# Patient Record
Sex: Female | Born: 1995 | Race: Asian | Hispanic: No | Marital: Single | Smoking: Never smoker
Health system: Southern US, Community
[De-identification: ages and names within clinical notes are randomized; demographics above are authoritative.]

## PROBLEM LIST (undated history)

## (undated) DIAGNOSIS — Z789 Other specified health status: Secondary | ICD-10-CM

## (undated) HISTORY — DX: Other specified health status: Z78.9

---
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
## 2019-11-05 LAB — PREGNANCY, URINE: Preg Test, Ur: POSITIVE

## 2020-01-25 ENCOUNTER — Encounter: Payer: Self-pay | Admitting: *Deleted

## 2020-01-25 ENCOUNTER — Ambulatory Visit (INDEPENDENT_AMBULATORY_CARE_PROVIDER_SITE_OTHER): Payer: Medicaid Other | Admitting: *Deleted

## 2020-01-25 ENCOUNTER — Other Ambulatory Visit: Payer: Self-pay

## 2020-01-25 DIAGNOSIS — Z3A17 17 weeks gestation of pregnancy: Secondary | ICD-10-CM

## 2020-01-25 DIAGNOSIS — O0990 Supervision of high risk pregnancy, unspecified, unspecified trimester: Secondary | ICD-10-CM

## 2020-01-25 DIAGNOSIS — O09899 Supervision of other high risk pregnancies, unspecified trimester: Secondary | ICD-10-CM | POA: Insufficient documentation

## 2020-01-25 MED ORDER — BLOOD PRESSURE KIT DEVI: 1.0000 | 0 refills | Status: AC | PRN

## 2020-01-25 MED ORDER — PRENATAL 27-0.8 MG PO TABS: 1.0000 | ORAL_TABLET | Freq: Every day | ORAL | 9 refills | Status: DC

## 2020-01-25 NOTE — Patient Instructions (Signed)

## 2020-01-25 NOTE — Progress Notes (Signed)
I connected with  Brandi Lee on 01/25/20 at  8:15 AM EDT by telephone and verified that I am speaking with the correct person using two identifiers.   I discussed the limitations, risks, security and privacy concerns of performing an evaluation and management service by telephone and the availability of in person appointments. I also discussed with the patient that there may be a patient responsible charge related to this service. The patient expressed understanding and agreed to proceed.  I explained I am completing her New OB Intake today. We discussed Her EDD and that it is based on  sure LMP . She had a pregnancy test done at [facility].  I reviewed her allergies, meds, OB History, Medical /Surgical history, and appropriate screenings. I informed her of [facility] services. She has a hx of PTD at 32 weeks by C/S.    I explained I will send her the [employer] app and app was sent to her while on phone.   I explained we will send a blood pressure cuff to [facility] that will fill that prescription and we called [facility] to verify they received her prescription and confirmed they will deliver to her home.  I asked her to bring the blood pressure cuff with her to her first ob appointment so we can show her how to use it. Explained  then we will have her take her blood pressure weekly and enter into the app.  I explained she will have some visits in office and some virtually. She already has [employer].  I reviewed her new ob  appointment date/ time with her , our location and to wear mask, no visitors.  I explained she will have a pelvic exam, ob bloodwork, hemoglobin a1C, cbg ,pap, and  genetic testing if desired,- she is undecided about a panorama.  I scheduled an [place] for first available for 02/10/20 and gave her the appointment. She voices understanding.  Greycen Felter,RN

## 2020-02-02 ENCOUNTER — Other Ambulatory Visit (HOSPITAL_COMMUNITY)
Admission: RE | Admit: 2020-02-02 | Discharge: 2020-02-02 | Disposition: A | Discharge status: Home / Self Care | Payer: Medicaid Other | Source: Ambulatory Visit | Attending: Family Medicine | Admitting: Family Medicine

## 2020-02-02 ENCOUNTER — Ambulatory Visit (INDEPENDENT_AMBULATORY_CARE_PROVIDER_SITE_OTHER): Payer: Medicaid Other | Admitting: Family Medicine

## 2020-02-02 ENCOUNTER — Encounter: Payer: Self-pay | Admitting: Family Medicine

## 2020-02-02 ENCOUNTER — Other Ambulatory Visit: Payer: Self-pay

## 2020-02-02 VITALS — BP 104/55 | HR 82 | Wt 104.9 lb

## 2020-02-02 DIAGNOSIS — O0990 Supervision of high risk pregnancy, unspecified, unspecified trimester: Secondary | ICD-10-CM

## 2020-02-02 DIAGNOSIS — O09292 Supervision of pregnancy with other poor reproductive or obstetric history, second trimester: Secondary | ICD-10-CM

## 2020-02-02 DIAGNOSIS — O09899 Supervision of other high risk pregnancies, unspecified trimester: Secondary | ICD-10-CM

## 2020-02-02 DIAGNOSIS — O34219 Maternal care for unspecified type scar from previous cesarean delivery: Secondary | ICD-10-CM

## 2020-02-02 DIAGNOSIS — Z98891 History of uterine scar from previous surgery: Secondary | ICD-10-CM | POA: Insufficient documentation

## 2020-02-02 DIAGNOSIS — Z3A19 19 weeks gestation of pregnancy: Secondary | ICD-10-CM

## 2020-02-02 LAB — POCT URINALYSIS DIP (DEVICE)
Bilirubin Urine: NEGATIVE
Glucose, UA: NEGATIVE mg/dL
Hgb urine dipstick: NEGATIVE
Ketones, ur: NEGATIVE mg/dL
Leukocytes,Ua: NEGATIVE
Nitrite: NEGATIVE
Protein, ur: NEGATIVE mg/dL
Specific Gravity, Urine: 1.01 (ref 1.005–1.030)
Urobilinogen, UA: 0.2 mg/dL (ref 0.0–1.0)
pH: 7 (ref 5.0–8.0)

## 2020-02-02 NOTE — Patient Instructions (Signed)
BENEFITS OF BREASTFEEDING Many women wonder if they should breastfeed. Research shows that breast milk contains the perfect balance of vitamins, protein and fat that your baby needs to grow. It also contains antibodies that help your baby's immune system to fight off viruses and bacteria and can reduce the risk of sudden infant death syndrome (SIDS). In addition, the colostrum (a fluid secreted from the breast in the first few days after delivery) helps your newborn's digestive system to grow and function well. Breast milk is easier to digest than formula. Also, if your baby is born preterm, breast milk can help to reduce both short- and long-term health problems. BENEFITS OF BREASTFEEDING FOR MOM . Breastfeeding causes a hormone to be released that helps the uterus to contract and return to its normal size more quickly. . It aids in postpartum weight loss, reduces risk of breast and ovarian cancer, heart disease and rheumatoid arthritis. . It decreases the amount of bleeding after the baby is born. benefits of breastfeeding for baby . Provides comfort and nutrition . Protects baby against - Obesity - Diabetes - Asthma - Childhood cancers - Heart disease - Ear infections - Diarrhea - Pneumonia - Stomach problems - Serious allergies - Skin rashes . Promotes growth and development . Reduces the risk of baby having Sudden Infant Death Syndrome (SIDS) only breastmilk for the first 6 months . Protects baby against diseases/allergies . It's the perfect amount for tiny bellies . It restores baby's energy . Provides the best nutrition for baby . Giving water or formula can make baby more likely to get sick, decrease Mom's milk supply, make baby less content with breastfeeding Skin to Skin After delivery, the staff will place your baby on your chest. This helps with the following: . Regulates baby's temperature, breathing, heart rate and blood sugar . Increases Mom's milk supply . Promotes  bonding . Keeps baby and Mom calm and decreases baby's crying Rooming In Your baby will stay in your room with you for the entire time you are in the hospital. This helps with the following: . Allows Mom to learn baby's feeding cues - Fluttering eyes - Sucking on tongue or hand - Rooting (opens mouth and turns head) - Nuzzling into the breast - Bringing hand to mouth . Allows breastfeeding on demand (when your baby is ready) . Helps baby to be calm and content . Ensures a good milk supply . Prevents complications with breastfeeding . Allows parents to learn to care for baby . Allows you to request assistance with breastfeeding Importance of a good latch . Increases milk transfer to baby - baby gets enough milk . Ensures you have enough milk for your baby . Decreases nipple soreness . Don't use pacifiers and bottles - these cause baby to suck differently than breastfeeding . Promotes continuation of breastfeeding Risks of Formula Supplementation with Breastfeeding Giving your infant formula in addition to your breast-milk EXCEPT when medically necessary can lead to: . Decreases your milk supply  . Loss of confidence in yourself for providing baby's nutrition  . Engorgement and possibly mastitis  . Asthma & allergies in the baby BREASTFEEDING FAQS How long should I breastfeed my baby? It is recommended that you provide your baby with breast milk only for the first 6 months and then continue for the first year and longer as desired. During the first few weeks after birth, your baby will need to feed 8-12 times every 24 hours, or every 2-3 hours. They will likely feed   for 15-30 minutes. How can I help my baby begin breastfeeding? Babies are born with an instinct to breastfeed. A healthy baby can begin breastfeeding right away without specific help. At the hospital, a nurse (or lactation consultant) will help you begin the process and will give you tips on good positioning. It may be  helpful to take a breastfeeding class before you deliver in order to know what to expect. How can I help my baby latch on? In order to assist your baby in latching-on, cup your breast in your hand and stroke your baby's lower lip with your nipple to stimulate your baby's rooting reflex. Your baby will look like he or she is yawning, at which point you should bring the baby towards your breast, while aiming the nipple at the roof of his or her mouth. Remember to bring the baby towards you and not your breast towards the baby. How can I tell if my baby is latched-on? Your baby will have all of your nipple and part of the dark area around the nipple in his or her mouth and your baby's nose will be touching your breast. You should see or hear the baby swallowing. If the baby is not latched-on properly, start the process over. To remove the suction, insert a clean finger between your breast and the baby's mouth. Should I switch breasts during feeding? After feeding on one side, switch the baby to your other breast. If he or she does not continue feeding - that is OK. Your baby will not necessarily need to feed from both breasts in a single feeding. On the next feeding, start with the other breast for efficiency and comfort. How can I tell if my baby is hungry? When your baby is hungry, they will nuzzle against your breast, make sucking noises and tongue motions and may put their hands near their mouth. Crying is a late sign of hunger, so you should not wait until this point. When they have received enough milk, they will unlatch from the breast. Is it okay to use a pacifier? Until your baby gets the hang of breastfeeding, experts recommend limiting pacifier usage. If you have questions about this, please contact your pediatrician. What can I do to ensure proper nutrition while breastfeeding? . Make sure that you support your own health and your baby's by eating a healthy, well-balanced diet . Your provider  may recommend that you continue to take your prenatal vitamin . Drink plenty of fluids. It is a good rule to drink one glass of water before or after feeding . Alcohol will remain in the breast milk for as long as it will remain in the blood stream. If you choose to have a drink, it is recommended that you wait at least 2 hours before feeding . Moderate amounts of caffeine are OK . Some over-the-counter or prescription medications are not recommended during breastfeeding. Check with your provider if you have questions What types of birth control methods are safe while breastfeeding? Progestin-only methods, including a daily pill, an IUD, the implant and the injection are safe while breastfeeding. Methods that contain estrogen (such as combination birth control pills, the vaginal ring and the patch) should not be used during the first month of breastfeeding as these can decrease your milk supply.  Safe Medications in Pregnancy   Acne:  Benzoyl Peroxide  Salicylic Acid   Backache/Headache:  Tylenol: 2 regular strength every 4 hours OR        2 Extra  strength every 6 hours   Colds/Coughs/Allergies:  Benadryl (alcohol free) 25 mg every 6 hours as needed  Breath right strips  Claritin  Cepacol throat lozenges  Chloraseptic throat spray  Cold-Eeze- up to three times per day  Cough drops, alcohol free  Flonase (by prescription only)  Guaifenesin  Mucinex  Robitussin DM (plain only, alcohol free)  Saline nasal spray/drops  Sudafed (pseudoephedrine) & Actifed * use only after [redacted] weeks gestation and if you do not have high blood pressure  Tylenol  Vicks Vaporub  Zinc lozenges  Zyrtec   Constipation:  Colace  Ducolax suppositories  Fleet enema  Glycerin suppositories  Metamucil  Milk of magnesia  Miralax  Senokot  Smooth move tea   Diarrhea:  Kaopectate  Imodium A-D   *NO pepto Bismol   Hemorrhoids:  Anusol  Anusol HC  Preparation H  Tucks   Indigestion:  Tums   Maalox  Mylanta  Zantac  Pepcid   Insomnia:  Benadryl (alcohol free) 25mg  every 6 hours as needed  Tylenol PM  Unisom, no Gelcaps   Leg Cramps:  Tums  MagGel   Nausea/Vomiting:  Bonine  Dramamine  Emetrol  Ginger extract  Sea bands  Meclizine  Nausea medication to take during pregnancy:  Unisom (doxylamine succinate 25 mg tablets) Take one tablet daily at bedtime. If symptoms are not adequately controlled, the dose can be increased to a maximum recommended dose of two tablets daily (1/2 tablet in the morning, 1/2 tablet mid-afternoon and one at bedtime).  Vitamin B6 100mg  tablets. Take one tablet twice a day (up to 200 mg per day).   Skin Rashes:  Aveeno products  Benadryl cream or 25mg  every 6 hours as needed  Calamine Lotion  1% cortisone cream   Yeast infection:  Gyne-lotrimin 7  Monistat 7    **If taking multiple medications, please check labels to avoid duplicating the same active ingredients  **take medication as directed on the label  ** Do not exceed 4000 mg of tylenol in 24 hours  **Do not take medications that contain aspirin or ibuprofen          Healthy Weight Gain During Pregnancy, Adult A certain amount of weight gain during pregnancy is normal and healthy. How much weight you should gain depends on your overall health and a measurement called BMI (body mass index). BMI is an estimate of your body fat based on your height and weight. You can use an to figure out your BMI, or you can ask your health care provider to calculate it for you at your next visit. Your recommended pregnancy weight gain is based on your pre-pregnancy BMI. General guidelines for a healthy total weight gain during pregnancy are listed below. If your BMI at or before the start of your pregnancy is:  Less than 18.5 (underweight), you should gain 28-40 lb (13-18 kg).  18.5-24.9 (normal weight), you should gain 25-35 lb (11-16 kg).  25-29.9 (overweight), you  should gain 15-25 lb (7-11 kg).  30 or higher (obese), you should gain 11-20 lb (5-9 kg). These ranges vary depending on your individual health. If you are carrying more than one baby (multiples), it may be safe to gain more weight than these recommendations. If you gain less weight than recommended, that may be safe as long as your baby is growing and developing normally. How can unhealthy weight gain affect me and my baby? Gaining too much weight during pregnancy can lead to pregnancy complications, such  as:  A temporary form of diabetes that develops during pregnancy (gestational diabetes).  High blood pressure during pregnancy and protein in your urine (preeclampsia).  High blood pressure during pregnancy without protein in your urine (gestational hypertension).  Your baby having a high weight at birth, which may: ? Raise your risk of having a more difficult delivery or a surgical delivery (cesarean delivery, or C-section). ? Raise your child's risk of developing obesity during childhood. Not gaining enough weight can be life-threatening for your baby, and it may raise your baby's chances of:  Being born early (preterm).  Growing more slowly than normal during pregnancy (growth restriction).  Having a low weight at birth. What actions can I take to gain a healthy amount of weight during pregnancy? General instructions  Keep track of your weight gain during pregnancy.  Take over-the-counter and prescription medicines only as told by your health care provider. Take all prenatal supplements as directed.  Keep all health care visits during pregnancy (prenatal visits). These visits are a good time to discuss your weight gain. Your health care provider will weigh you at each visit to make sure you are gaining a healthy amount of weight. Nutrition   Eat a balanced, nutrient-rich diet. Eat plenty of: ? Fruits and vegetables, such as berries and broccoli. ? Whole grains, such as  millet, barley, whole-wheat breads and cereals, and oatmeal. ? Low-fat dairy products or non-dairy products such as almond milk or rice milk. ? Protein foods, such as lean meat, chicken, eggs, and legumes (such as peas, beans, soybeans, and lentils).  Avoid foods that are fried or have a lot of fat, salt (sodium), or sugar.  Drink enough fluid to keep your urine pale yellow.  Choose healthy snack and drink options when you are at work or on the go: ? Drink water. Avoid soda, sports drinks, and juices that have added sugar. ? Avoid drinks with caffeine, such as coffee and energy drinks. ? Eat snacks that are high in protein, such as nuts, protein bars, and low-fat yogurt. ? Carry convenient snacks in your purse that do not need refrigeration, such as a pack of trail mix, an apple, or a granola bar.  If you need help improving your diet, work with a health care provider or a diet and nutrition specialist (dietitian). Activity   Exercise regularly, as told by your health care provider. ? If you were active before becoming pregnant, you may be able to continue your regular fitness activities. ? If you were not active before pregnancy, you may gradually build up to exercising for 30 or more minutes on most days of the week. This may include walking, swimming, or yoga.  Ask your health care provider what activities are safe for you. Talk with your health care provider about whether you may need to be excused from certain school or work activities. Where to find more information Learn more about managing your weight gain during pregnancy from:  American Pregnancy Association: www.americanpregnancy.org  U.[place]. Department of Agriculture pregnancy weight gain calculator: FormerBoss.no Summary  Too much weight gain during pregnancy can lead to complications for you and your baby.  Find out your pre-pregnancy BMI to determine how much weight gain is healthy for you.  Eat nutritious foods  and stay active.  Keep all of your prenatal visits as told by your health care provider. This information is not intended to replace advice given to you by your health care provider. Make sure you discuss any questions  you have with your health care provider. Document Revised: 05/13/2019 Document Reviewed: 05/10/2017 Elsevier Patient Education  2020 [employer].

## 2020-02-02 NOTE — Progress Notes (Signed)
Here for new ob visit. She did receive her bp cuff from [employer]. She brought her bp cuff and I showed her  How to use it. She returned the demonstartion properly. I reviewed bp guidelines for home.  She has not downloaded Babyscripts because she can't remember her email. She will set up another email and contact us so we can resend babyscripts if needed. She declines panorma/ horizon. Legrand Como

## 2020-02-02 NOTE — Progress Notes (Signed)
History:   Joyanna Kleman is a 24 y.o. G2P0101 at 43w6dby LMP being seen today for her first obstetrical visit.  Her obstetrical history is significant for PTL with C/S at 32w for NRFHT. Patient does intend to breast feed. Pregnancy history fully reviewed.  Patient reports no complaints.      HISTORY: OB History  Gravida Para Term Preterm AB Living  2 1 0 1 0 1  SAB TAB Ectopic Multiple Live Births  0 0 0 0 1    # Outcome Date GA Lbr Len/2nd Weight Sex Delivery Anes PTL Lv  2 Current           1 Preterm 06/13/15 349w0d2 lb (0.907 kg) F    LIV     Birth Comments: PTL, decreased fetal movement and NRFHR -C/S, nucal cord x4    Last pap smear: unsure; today  History reviewed. No pertinent past medical history. Past Surgical History:  Procedure Laterality Date  . CESAREAN SECTION     History reviewed. No pertinent family history. Social History   Tobacco Use  . Smoking status: Never Smoker  . Smokeless tobacco: Never Used  Substance Use Topics  . Alcohol use: No  . Drug use: No   No Known Allergies Current Outpatient Medications on File Prior to Visit  Medication Sig Dispense Refill  . Prenatal Vit-Fe Fumarate-FA (MULTIVITAMIN-PRENATAL) 27-0.8 MG TABS tablet Take 1 tablet by mouth daily at 12 noon. 30 tablet 9  . azithromycin (ZITHROMAX Z-PAK) 250 MG tablet Azithromycin 50055mn day 1, then 250m65m days 2-4 6 tablet 0  . Blood Pressure Monitoring (BLOOD PRESSURE KIT) DEVI 1 Device by Does not apply route as needed. 1 each 0  . cetirizine (ZYRTEC) 10 MG chewable tablet Chew 1 tablet (10 mg total) by mouth daily. Start taking after you are done taking the prednisone pack. 30 tablet 0  . naproxen (NAPROSYN) 375 MG tablet Take 1 tablet (375 mg total) by mouth 2 (two) times daily. 60 tablet 2  . sodium chloride (OCEAN NASAL SPRAY) 0.65 % nasal spray Place 1 spray into the nose as needed for congestion. 30 mL 12  . traMADol (ULTRAM) 50 MG tablet Take 1 tablet (50 mg total) by  mouth every 6 (six) hours as needed for pain. Use for severe headache episodes. 15 tablet 0   No current facility-administered medications on file prior to visit.    Review of Systems Pertinent items noted in HPI and remainder of comprehensive ROS otherwise negative. Physical Exam:   Vitals:   02/02/20 1445  BP: (!) 104/55  Pulse: 82  Weight: 104 lb 14.4 oz (47.6 kg)   Fetal Heart Rate (bpm): 155    CONSTITUTIONAL: Well-developed, well-nourished female in no acute distress.  HEENT:  Normocephalic, atraumatic. External right and left ear normal. No scleral icterus.  NECK: Normal range of motion SKIN: No rash noted. Not diaphoretic. No erythema. No pallor. MUSCULOSKELETAL: Normal range of motion. No edema noted. NEUROLOGIC: Alert and oriented to person, place, and time. Normal muscle tone coordination. No cranial nerve deficit noted. PSYCHIATRIC: Normal mood and affect. Normal behavior. Normal judgment and thought content. CARDIOVASCULAR: Normal heart rate noted RESPIRATORY: Effort normal, no problems with respiration noted ABDOMEN: Soft, non-tender. PELVIC: Normal appearing external genitalia; normal appearing vaginal mucosa and cervix.  No abnormal discharge noted.  Normal uterine size, no other palpable masses, no uterine or adnexal tenderness.  Assessment:    Pregnancy: G2P0101 Patient Active Problem List  Diagnosis Date Noted  . History of cesarean section 02/02/2020  . Supervision of high risk pregnancy, antepartum 01/25/2020  . History of preterm delivery, currently pregnant 01/25/2020     Plan:    Clotilda was seen today for initial prenatal visit.  Diagnoses and all orders for this visit:  Supervision of high risk pregnancy, antepartum -     CBC/D/Plt+RPR+Rh+ABO+Rub Ab... -     Hemoglobin A1c -     Culture, OB Urine -     Cytology - PAP( [facility]) -     Cervicovaginal ancillary only( [facility])  History of preterm delivery, currently pregnant        - Discussed 17P and patient declined  History of cesarean section       - Op note requested for CS; patient does not remember being told she could not have a vaginal delivery. Desires TOLAC if possible.   Initial labs drawn. Continue prenatal vitamins. Problem list reviewed and updated. Genetic Screening discussed, First trimester screen, Quad screen and NIPS: declined. Ultrasound discussed; fetal anatomic survey: already scheduled Discussed usage of Babyscripts and virtual visits as additional source of managing and completing prenatal visits in midst of coronavirus and pandemic.   Anticipatory guidance for prenatal visits including labs, ultrasounds, and testing; Initial labs drawn. Encouraged to complete MyChart Registration for her ability to review results, send requests, and have questions addressed.  The nature of [place] for [facility] with multiple MDs and Advanced Practice Providers was explained to patient; also emphasized that residents, students are part of our team. Routine obstetric precautions reviewed. Encouraged to seek out care at office or emergency room [facility] preferred) for urgent and/or emergent concerns. Return in about 4 weeks (around 03/01/2020) for [facility]; in-person per patient preference .    Barrington Ellison, MD [facility] Fellow, [facility] for [employer], [place]

## 2020-02-03 LAB — CBC/D/PLT+RPR+RH+ABO+RUB AB...
Antibody Screen: NEGATIVE
Basophils Absolute: 0 10*3/uL (ref 0.0–0.2)
Basos: 0 %
EOS (ABSOLUTE): 0.3 10*3/uL (ref 0.0–0.4)
Eos: 3 %
HCV Ab: <0.1 s/co ratio (ref 0.0–0.9)
HIV Screen 4th Generation wRfx: NONREACTIVE
Hematocrit: 32.1 % — ABNORMAL LOW (ref 34.0–46.6)
Hemoglobin: 10.7 g/dL — ABNORMAL LOW (ref 11.1–15.9)
Hepatitis B Surface Ag: NEGATIVE
Immature Grans (Abs): 0 10*3/uL (ref 0.0–0.1)
Immature Granulocytes: 1 %
Lymphocytes Absolute: 1.6 10*3/uL (ref 0.7–3.1)
Lymphs: 20 %
MCH: 27.3 pg (ref 26.6–33.0)
MCHC: 33.3 g/dL (ref 31.5–35.7)
MCV: 82 fL (ref 79–97)
Monocytes Absolute: 0.6 10*3/uL (ref 0.1–0.9)
Monocytes: 8 %
Neutrophils Absolute: 5.4 10*3/uL (ref 1.4–7.0)
Neutrophils: 68 %
Platelets: 251 10*3/uL (ref 150–450)
RBC: 3.92 x10E6/uL (ref 3.77–5.28)
RDW: 13.4 % (ref 11.7–15.4)
RPR Ser Ql: NONREACTIVE
Rh Factor: POSITIVE
Rubella Antibodies, IGG: 12.8 index (ref >0.99)
WBC: 7.9 10*3/uL (ref 3.4–10.8)

## 2020-02-03 LAB — HEMOGLOBIN A1C
Est. average glucose Bld gHb Est-mCnc: 88 mg/dL
Hgb A1c MFr Bld: 4.7 % — ABNORMAL LOW (ref 4.8–5.6)

## 2020-02-03 LAB — HCV INTERPRETATION

## 2020-02-04 LAB — CYTOLOGY - PAP: Diagnosis: NEGATIVE

## 2020-02-04 LAB — CERVICOVAGINAL ANCILLARY ONLY
Chlamydia: NEGATIVE
Comment: NEGATIVE
Comment: NORMAL
Neisseria Gonorrhea: NEGATIVE

## 2020-02-04 LAB — CULTURE, OB URINE

## 2020-02-04 LAB — URINE CULTURE, OB REFLEX: Organism ID, Bacteria: NO GROWTH

## 2020-02-10 ENCOUNTER — Ambulatory Visit: Payer: Medicaid Other | Attending: Family Medicine

## 2020-02-10 ENCOUNTER — Other Ambulatory Visit: Payer: Self-pay

## 2020-02-10 ENCOUNTER — Ambulatory Visit: Payer: Medicaid Other | Admitting: *Deleted

## 2020-02-10 ENCOUNTER — Other Ambulatory Visit: Payer: Self-pay | Admitting: *Deleted

## 2020-02-10 DIAGNOSIS — O365921 Maternal care for other known or suspected poor fetal growth, second trimester, fetus 1: Secondary | ICD-10-CM | POA: Diagnosis not present

## 2020-02-10 DIAGNOSIS — O09212 Supervision of pregnancy with history of pre-term labor, second trimester: Secondary | ICD-10-CM | POA: Diagnosis not present

## 2020-02-10 DIAGNOSIS — Z3A21 21 weeks gestation of pregnancy: Secondary | ICD-10-CM

## 2020-02-10 DIAGNOSIS — O09292 Supervision of pregnancy with other poor reproductive or obstetric history, second trimester: Secondary | ICD-10-CM

## 2020-02-10 DIAGNOSIS — O34219 Maternal care for unspecified type scar from previous cesarean delivery: Secondary | ICD-10-CM

## 2020-02-10 DIAGNOSIS — O0990 Supervision of high risk pregnancy, unspecified, unspecified trimester: Secondary | ICD-10-CM | POA: Diagnosis present

## 2020-02-10 DIAGNOSIS — O09899 Supervision of other high risk pregnancies, unspecified trimester: Secondary | ICD-10-CM | POA: Insufficient documentation

## 2020-02-10 DIAGNOSIS — Z363 Encounter for antenatal screening for malformations: Secondary | ICD-10-CM

## 2020-02-10 DIAGNOSIS — O359XX0 Maternal care for (suspected) fetal abnormality and damage, unspecified, not applicable or unspecified: Secondary | ICD-10-CM

## 2020-03-02 ENCOUNTER — Other Ambulatory Visit: Payer: Self-pay

## 2020-03-02 ENCOUNTER — Ambulatory Visit (INDEPENDENT_AMBULATORY_CARE_PROVIDER_SITE_OTHER): Payer: Medicaid Other | Admitting: Family Medicine

## 2020-03-02 VITALS — BP 112/64 | HR 81 | Wt 108.0 lb

## 2020-03-02 DIAGNOSIS — Z3A24 24 weeks gestation of pregnancy: Secondary | ICD-10-CM

## 2020-03-02 DIAGNOSIS — O0992 Supervision of high risk pregnancy, unspecified, second trimester: Secondary | ICD-10-CM | POA: Diagnosis not present

## 2020-03-02 DIAGNOSIS — O09899 Supervision of other high risk pregnancies, unspecified trimester: Secondary | ICD-10-CM

## 2020-03-02 DIAGNOSIS — O09212 Supervision of pregnancy with history of pre-term labor, second trimester: Secondary | ICD-10-CM

## 2020-03-02 DIAGNOSIS — Z98891 History of uterine scar from previous surgery: Secondary | ICD-10-CM

## 2020-03-02 DIAGNOSIS — O34219 Maternal care for unspecified type scar from previous cesarean delivery: Secondary | ICD-10-CM

## 2020-03-02 DIAGNOSIS — Z8759 Personal history of other complications of pregnancy, childbirth and the puerperium: Secondary | ICD-10-CM | POA: Diagnosis not present

## 2020-03-02 DIAGNOSIS — O0990 Supervision of high risk pregnancy, unspecified, unspecified trimester: Secondary | ICD-10-CM

## 2020-03-02 NOTE — Progress Notes (Signed)
   PRENATAL VISIT NOTE  Subjective:  Brandi Lee is a 24 y.o. G2P0101 at [redacted]w[redacted]d being seen today for ongoing prenatal care.  She is currently monitored for the following issues for this high-risk pregnancy and has Supervision of high risk pregnancy, antepartum; History of preterm delivery, currently pregnant; and History of cesarean section on their problem list.  Patient reports no complaints.  Contractions: Not present. Vag. Bleeding: None.  Movement: Present. Denies leaking of fluid.   The following portions of the patient's history were reviewed and updated as appropriate: allergies, current medications, past family history, past medical history, past social history, past surgical history and problem list.   Objective:   Vitals:   03/02/20 0829  BP: 112/64  Pulse: 81  Weight: 108 lb (49 kg)    Fetal Status: Fetal Heart Rate (bpm): 154   Movement: Present     General:  Alert, oriented and cooperative. Patient is in no acute distress.  Skin: Skin is warm and dry. No rash noted.   Cardiovascular: Normal heart rate noted  Respiratory: Normal respiratory effort, no problems with respiration noted  Abdomen: Soft, gravid, appropriate for gestational age.  Pain/Pressure: Absent     Pelvic: Cervical exam deferred        Extremities: Normal range of motion.  Edema: None  Mental Status: Normal mood and affect. Normal behavior. Normal judgment and thought content.   Assessment and Plan:  Pregnancy: G2P0101 at [redacted]w[redacted]d 1. Supervision of high risk pregnancy, antepartum FHT and FH normal  2. History of preterm delivery, currently pregnant Due to FGR.  3. History of prior pregnancy with IUGR newborn Serial [place]  4. H/o C/s Patient thinking about TOLAC. Risks/benefits discussed. Patient still wants to think about it.  Preterm labor symptoms and general obstetric precautions including but not limited to vaginal bleeding, contractions, leaking of fluid and fetal movement were reviewed in detail  with the patient. Please refer to After Visit Summary for other counseling recommendations.   No follow-ups on file.  Future Appointments  Date Time Provider Department Center  03/09/2020 10:45 AM [facility] NURSE [facility]  03/09/2020 10:45 AM [facility] US5 [facility]    Levie Heritage, DO

## 2020-03-03 ENCOUNTER — Encounter: Payer: Self-pay | Admitting: General Practice

## 2020-03-09 ENCOUNTER — Other Ambulatory Visit: Payer: Self-pay | Admitting: *Deleted

## 2020-03-09 ENCOUNTER — Ambulatory Visit: Payer: Medicaid Other | Attending: Obstetrics and Gynecology

## 2020-03-09 ENCOUNTER — Other Ambulatory Visit: Payer: Self-pay

## 2020-03-09 ENCOUNTER — Ambulatory Visit: Payer: Medicaid Other | Admitting: *Deleted

## 2020-03-09 DIAGNOSIS — O09899 Supervision of other high risk pregnancies, unspecified trimester: Secondary | ICD-10-CM | POA: Insufficient documentation

## 2020-03-09 DIAGNOSIS — O34219 Maternal care for unspecified type scar from previous cesarean delivery: Secondary | ICD-10-CM

## 2020-03-09 DIAGNOSIS — O0990 Supervision of high risk pregnancy, unspecified, unspecified trimester: Secondary | ICD-10-CM | POA: Insufficient documentation

## 2020-03-09 DIAGNOSIS — O09292 Supervision of pregnancy with other poor reproductive or obstetric history, second trimester: Secondary | ICD-10-CM | POA: Diagnosis present

## 2020-03-09 DIAGNOSIS — O09212 Supervision of pregnancy with history of pre-term labor, second trimester: Secondary | ICD-10-CM

## 2020-03-09 DIAGNOSIS — Z362 Encounter for other antenatal screening follow-up: Secondary | ICD-10-CM

## 2020-03-09 DIAGNOSIS — O359XX0 Maternal care for (suspected) fetal abnormality and damage, unspecified, not applicable or unspecified: Secondary | ICD-10-CM

## 2020-03-09 DIAGNOSIS — Z3A25 25 weeks gestation of pregnancy: Secondary | ICD-10-CM

## 2020-03-30 ENCOUNTER — Other Ambulatory Visit: Payer: Medicaid Other

## 2020-03-30 ENCOUNTER — Other Ambulatory Visit: Payer: Self-pay | Admitting: General Practice

## 2020-03-30 ENCOUNTER — Ambulatory Visit (INDEPENDENT_AMBULATORY_CARE_PROVIDER_SITE_OTHER): Payer: Medicaid Other | Admitting: Obstetrics and Gynecology

## 2020-03-30 VITALS — BP 108/73 | HR 92 | Wt 115.7 lb

## 2020-03-30 DIAGNOSIS — O0990 Supervision of high risk pregnancy, unspecified, unspecified trimester: Secondary | ICD-10-CM

## 2020-03-30 DIAGNOSIS — Z3A28 28 weeks gestation of pregnancy: Secondary | ICD-10-CM

## 2020-03-30 DIAGNOSIS — O09899 Supervision of other high risk pregnancies, unspecified trimester: Secondary | ICD-10-CM

## 2020-03-30 DIAGNOSIS — Z23 Encounter for immunization: Secondary | ICD-10-CM | POA: Diagnosis not present

## 2020-03-30 DIAGNOSIS — O09893 Supervision of other high risk pregnancies, third trimester: Secondary | ICD-10-CM

## 2020-03-30 DIAGNOSIS — Z98891 History of uterine scar from previous surgery: Secondary | ICD-10-CM

## 2020-03-30 DIAGNOSIS — O0993 Supervision of high risk pregnancy, unspecified, third trimester: Secondary | ICD-10-CM

## 2020-03-30 NOTE — Progress Notes (Signed)
   PRENATAL VISIT NOTE  Subjective:  Brandi Lee is a 24 y.o. G2P0101 at [redacted]w[redacted]d being seen today for ongoing prenatal care.  She is currently monitored for the following issues for this high-risk pregnancy and has Supervision of high risk pregnancy, antepartum; History of preterm delivery, currently pregnant; and History of cesarean section on their problem list.  Patient doing well with no acute concerns today. She reports no complaints.  Contractions: Not present. Vag. Bleeding: None.  Movement: Present. Denies leaking of fluid.   After much thought, pt has decided that she wants a repeat c section.  Discussed this will be performed at 39 weeks unless medically indicated.  Pt desires IUD for contraception but is unsure of what type.  Reviewed chart, no op note seen, will rerequest from [facility].  The following portions of the patient's history were reviewed and updated as appropriate: allergies, current medications, past family history, past medical history, past social history, past surgical history and problem list. Problem list updated.  Objective:   Vitals:   03/30/20 0816  BP: 108/73  Pulse: 92  Weight: 115 lb 11.2 oz (52.5 kg)    Fetal Status: Fetal Heart Rate (bpm): 155 Fundal Height: 28 cm Movement: Present     General:  Alert, oriented and cooperative. Patient is in no acute distress.  Skin: Skin is warm and dry. No rash noted.   Cardiovascular: Normal heart rate noted  Respiratory: Normal respiratory effort, no problems with respiration noted  Abdomen: Soft, gravid, appropriate for gestational age.  Pain/Pressure: Absent     Pelvic: Cervical exam deferred        Extremities: Normal range of motion.  Edema: None  Mental Status:  Normal mood and affect. Normal behavior. Normal judgment and thought content.   Assessment and Plan:  Pregnancy: G2P0101 at [redacted]w[redacted]d  1. Supervision of high risk pregnancy, antepartum Pt desires repeat c section with IUD,  28 week labs today  2.  History of preterm delivery, currently pregnant No s/sx of preterm labor  3. History of cesarean section Pt desires repeat c section, per pt this will likely be her last child  Preterm labor symptoms and general obstetric precautions including but not limited to vaginal bleeding, contractions, leaking of fluid and fetal movement were reviewed in detail with the patient.  Please refer to After Visit Summary for other counseling recommendations.   Return in about 2 weeks (around 04/13/2020) for [facility], in person.   Mariel Aloe, MD

## 2020-03-31 LAB — CBC
Hematocrit: 32.4 % — ABNORMAL LOW (ref 34.0–46.6)
Hemoglobin: 10.5 g/dL — ABNORMAL LOW (ref 11.1–15.9)
MCH: 26.7 pg (ref 26.6–33.0)
MCHC: 32.4 g/dL (ref 31.5–35.7)
MCV: 82 fL (ref 79–97)
Platelets: 238 10*3/uL (ref 150–450)
RBC: 3.93 x10E6/uL (ref 3.77–5.28)
RDW: 12.9 % (ref 11.7–15.4)
WBC: 7.6 10*3/uL (ref 3.4–10.8)

## 2020-03-31 LAB — HIV ANTIBODY (ROUTINE TESTING W REFLEX): HIV Screen 4th Generation wRfx: NONREACTIVE

## 2020-03-31 LAB — GLUCOSE TOLERANCE, 2 HOURS W/ 1HR
Glucose, 1 hour: 115 mg/dL (ref 65–179)
Glucose, 2 hour: 99 mg/dL (ref 65–152)
Glucose, Fasting: 68 mg/dL (ref 65–91)

## 2020-03-31 LAB — RPR: RPR Ser Ql: NONREACTIVE

## 2020-04-06 ENCOUNTER — Encounter: Payer: Self-pay | Admitting: *Deleted

## 2020-04-13 ENCOUNTER — Encounter: Payer: Self-pay | Admitting: Obstetrics & Gynecology

## 2020-04-13 ENCOUNTER — Other Ambulatory Visit: Payer: Self-pay

## 2020-04-13 ENCOUNTER — Ambulatory Visit (INDEPENDENT_AMBULATORY_CARE_PROVIDER_SITE_OTHER): Payer: Medicaid Other | Admitting: Obstetrics & Gynecology

## 2020-04-13 VITALS — BP 105/62 | HR 90 | Wt 118.9 lb

## 2020-04-13 DIAGNOSIS — O0990 Supervision of high risk pregnancy, unspecified, unspecified trimester: Secondary | ICD-10-CM

## 2020-04-13 DIAGNOSIS — O34219 Maternal care for unspecified type scar from previous cesarean delivery: Secondary | ICD-10-CM

## 2020-04-13 NOTE — Progress Notes (Signed)
   PRENATAL VISIT NOTE  Subjective:  Brandi Lee is a 24 y.o. G2P0101 at [redacted]w[redacted]d being seen today for ongoing prenatal care.  She is currently monitored for the following issues for this low-risk pregnancy and has Supervision of high risk pregnancy, antepartum; History of preterm delivery, currently pregnant; and History of cesarean section on their problem list.  Patient reports no complaints.  Contractions: Not present. Vag. Bleeding: None.  Movement: Present. Denies leaking of fluid.   The following portions of the patient's history were reviewed and updated as appropriate: allergies, current medications, past family history, past medical history, past social history, past surgical history and problem list.   Objective:   Vitals:   04/13/20 1551  BP: 105/62  Pulse: 90  Weight: 118 lb 14.4 oz (53.9 kg)    Fetal Status: Fetal Heart Rate (bpm): 144   Movement: Present     General:  Alert, oriented and cooperative. Patient is in no acute distress.  Skin: Skin is warm and dry. No rash noted.   Cardiovascular: Normal heart rate noted  Respiratory: Normal respiratory effort, no problems with respiration noted  Abdomen: Soft, gravid, appropriate for gestational age.  Pain/Pressure: Present     Pelvic: Cervical exam deferred        Extremities: Normal range of motion.  Edema: Trace  Mental Status: Normal mood and affect. Normal behavior. Normal judgment and thought content.   Assessment and Plan:  Pregnancy: G2P0101 at [redacted]w[redacted]d 1. Supervision of high risk pregnancy, antepartum Mother doing well. Plan for C/S at 39 weeks. Follow up in 2 weeks.   Preterm labor symptoms and general obstetric precautions including but not limited to vaginal bleeding, contractions, leaking of fluid and fetal movement were reviewed in detail with the patient. Please refer to After Visit Summary for other counseling recommendations.   No follow-ups on file.  Future Appointments  Date Time Provider Department  Center  04/20/2020  9:30 AM [facility] NURSE [facility]  04/20/2020  9:45 AM [facility] US5 [facility]  04/27/2020  3:55 PM Alric Seton, MD [facility]  05/11/2020  3:55 PM Adam Phenix, MD [facility]    Malachy Chamber, MD

## 2020-04-13 NOTE — Progress Notes (Signed)
Addendum: Patient not using babyscripts- she states she never received the email. I explained we will reach out to Babys[employer] to resend the email.  Korbyn Chopin,RN

## 2020-04-13 NOTE — Patient Instructions (Signed)
AREA PEDIATRIC/FAMILY PRACTICE PHYSICIANS  [place] ([address]) . [facility] o Chambliss, MD; Eniola, MD; Hale, MD; Hensel, MD; McDiarmid, MD; McIntyer, MD; Neal, MD; Walden, MD o [address] [place], [place] [address] o [phone] o Mon-Fri 8:30-12:30, 1:30-5:00 o Providers come to see babies at [facility] o Accepting Medicaid . [facility] o Limited providers who accept newborns: Koirala, MD; Morrow, MD; Wolters, MD o [address], [place], [place] [address] o [phone] o Mon-Fri 8:00-5:30 o Babies seen by providers at [facility] o Does NOT accept Medicaid o Please call early in hospitalization for appointment (limited availability)  . [facility] o Mulberry, MD o [address] [place], [place] [address] o [phone] o Mon, Tue, Thur, Fri 8:30-5:00, Wed 10:00-7:00 (closed 1-2pm) o Babies seen by [facility] providers o Accepting Medicaid . [facility] o Rubin, MD o [address], [place], [place] [address] o [phone] o Mon-Fri 8:30-5:00, Sat 8:30-12:00 o Provider comes to see babies at [facility] o Accepting Medicaid o Must have been referred from current patients or contacted office prior to delivery . [facility] ([facility]) o Brown, MD; Chandler, MD; Ettefagh, MD; Grant, MD; Lester, MD; McCormick, MD; McQueen, MD; Prose, MD; Simha, MD; Stanley, MD; Stryffeler, NP; Tebben, NP o [address], [place], [place] [address] o [phone] o Mon, Tue, Thur, Fri 8:30-5:30, Wed 9:30-5:30, Sat 8:30-12:30 o Babies seen by [facility] providers o Accepting Medicaid o Only accepting infants of first-time parents or siblings of current patients o Hospital discharge coordinator will make follow-up appointment . Jack Amos o [address],  [place], [place]  [address] o [phone]   Fax - [phone] . [facility] o [address], [place], [place]  [address] o Phone - [phone]   Fax - [phone] . Shilpa Gosrani o [address], [place], [place]  [address] o [phone]  [place] ([address]) . [facility] o Bates, MD; [place]field, MD; Cooper, Cox, MD; MD; Davis, MD; Dovico, MD; Ettefaugh, MD; Little, MD; Lowe, MD; Keiffer, MD; Melvin, MD; Sumner, MD; Williams, MD o [address], [place], [place] [address] o [phone] o Mon-Fri 8:30-5:00 (extended evenings Mon-Thur as needed), Sat-Sun 10:00-1:00 o Providers come to see babies at [facility] o Accepting Medicaid for families of first-time babies and families with all children in the household age 3 and under. Must register with office prior to making appointment (M-F only). . [facility] o Henson, NP; Knapp, MD; Lalonde, MD; Tysinger, PA o [address] [place], [place] [address] o [phone] o Mon-Fri 8:00-5:00 o Babies seen by providers at [facility] o Does NOT accept Medicaid/Commercial Insurance Only . [facility] ([facility])  o Artis, MD; Barnes, MD; Bratton, MD; Coccaro, MD; Lockett Gardner, MD; Kramer, MD; Marshall, MD; Netherton, MD; Poleto, MD; Skinner, MD o [address] [place], [place] [address] o [phone] o Mon-Fri 8:30-5:30, Sat (Oct.-Mar.) 9:00-1:00 o Babies seen by providers at [facility] o Accepting Medicaid  [place] ([address]) . [facility] o Reid, MD; Warner, MD o [address], , [place] [address] o [phone] o Mon-Fri 8:30-5:00, Sat 8:30-12:00 o Providers come to see babies at [facility] o Does NOT accept Medicaid . [facility] o Becker, PA; Hagler, MD; Scifres, PA; Sun, MD; Swayne, MD o [address],  [place], [place] [address] o [phone] o Mon-Fri 8:00-5:00 o Babies seen by providers at [facility] o Does NOT accept Medicaid o Only accepting babies of parents who are patients o Please call early in hospitalization for appointment (limited availability) . [facility] o Clark, MD; Frye, MD; Kelleher, MD; Mack, NP; Miller, MD; O'Keller, MD; Patterson, NP; Pudlo, MD; Puzio, MD; Thomas, MD; Tucker, MD; Twiselton, MD o [address], [place], [place] [address] o [phone] o Mon-Fri 8:00-5:00, Sat 9:00-12:00 o Providers come to see babies at [facility] o Does NOT accept Medicaid  [place] ([address]) . [facility] o Limited providers accepting new patients: Brake, NP; Wharton, PA o [address], [place], [place] [address] o [phone] o Mon-Fri 8:00-5:00 o Babies seen by providers at [facility] o Does NOT accept Medicaid o Only accepting babies of parents who are patients o Please call early in hospitalization for appointment (limited availability) . [facility] o Gay, MD; Quinlan, MD o [address] [place], [place] [address] o [phone] (press 1 to schedule appointment) o Mon-Fri 8:00-5:00 o Providers come to see babies at [facility] o Does NOT accept Medicaid . [facility] o Mazer, MD o [address] [place], [place] [address] o [phone] o Mon-Fri 8:30-5:00 (lunch 12:30-1:00), extended hours by appointment only Wed 5:00-6:30 o Babies seen by [facility] providers o Accepting Medicaid . [facility] o Banks, MD; Jordan, MD; Koberlein, MD o [address], [place], [place] [address] o [phone] o Mon-Fri 8:00-5:00 o Babies seen by [facility] providers o Does NOT accept Medicaid . [facility] o Parker, MD; Hunter, MD; Wallace, DO o [address] [place], [place]  [address] o [phone] o Mon-Fri 8:00-5:00 o Babies seen by [facility] providers o Does NOT accept Medicaid . [facility] o Brandon, PA; Brecken, PA; Christy, NP; Dees, MD; DeClaire, MD; DeWeese, MD; Hansen, NP; Mills, NP; Parrish, NP; Smoot, NP; Summer, MD; Vapne, MD o [address] [place], [place] [address] o [phone] o Mon-Fri 8:30-5:00, Sat 10:00-1:00 o Providers come to see babies at [facility] o Does NOT accept Medicaid o Free prenatal information session Tuesdays at 4:45pm . [facility] o Bouska, MD; Gordon, PA; Jeffery, PA; Weber, PA o [address] [place] [address] o [phone] o Mon-Fri 7:30-5:30 o Babies seen by [facility] providers . [facility] o [address], [place], [place]  [address] o [phone]   Fax - [phone]  [place] ([address]) . [facility] o Reese, MD o [address] [place], [place] [address] o [phone] o Mon-Thur 8:00-6:00 o Providers come to see babies at [facility] o Accepting Medicaid . [facility] o Anderson, NP; Badger, MD; Beal, PA; Spencer, PA o [address] [place], [place] [address] o [phone] o Mon-Thur 7:30-7:30, Fri 7:30-4:30 o Babies seen by [facility] providers o Accepting Medicaid . [facility] o Agbuya, MD; Klett, NP; Romgoolam, MD o [address], [place],  [address] o [phone] o Mon-Fri 8:30-5:00, Sat 8:30-12:00 o Providers come to see babies at [facility] o Accepting Medicaid o Must have "Meet & Greet" appointment at office prior to delivery . [facility] o McCord,   MD; Wallace, MD; Wood, MD o [address], [place], [place] [address] o [phone] o Mon-Wed 8:00-6:00, Thur-Fri 8:00-5:00, Sat 9:00-12:00 o Providers come to  see babies at [facility] o Does NOT accept Medicaid o Only accepting siblings of current patients . [facility]  o [address], [place], [place]  [address] o [phone]   Fax - [phone] . [facility] o [address], [place], [place]  [address] o [phone]   Fax - [phone]  [place] ([address]) . [facility] o Cirigliano, DO; Matthews, DO o [address] [place], [place] [address] o [phone] o Mon-Fri 7:00-5:00 o Babies seen by [facility] providers o Does NOT accept Medicaid . [facility] o Briscoe, MD; Howley, PA; Moreira, PA o [address], [place], [place] [address] o [phone] o Mon-Fri 8:00-5:00 o Babies seen by [facility] providers o Accepting Medicaid . [facility] o Boyd, MD; Church, PA; Jones, NP; Osborn, PA o [address], , [place] [address] o [phone] o Mon-Fri 8:00-5:00 o Babies seen by providers at [facility] o Accepting Medicaid  [place] ([address]) . [facility] [place] o Wendling, DO o [address] [place], [place] [address] o [phone] o Mon-Fri 8:00-5:00 o Babies seen by [facility] providers o Does NOT accept Medicaid o Limited availability, please call early in hospitalization to schedule follow-up . [facility] o Calderon, PA; Cummings, MD; Dillard, MD; Martin, PA; Olson, MD; VanDeven, PA o [address], [place], [place] [address] o [phone] o Mon-Fri 8:30-5:00, Sat 9:00-12:00 o Babies seen by providers at [facility] o Accepting Medicaid o Please register online then schedule online or call office o [email or web address] . [facility] ([facility] o Hunter, NP; Kumar, MD; Martin Rogers, PA o [address], [place], [place] [address] o [phone] o Mon-Fri 8:00-5:00 o Babies seen by providers at [facility] o Accepting Medicaid . [facility] ([facility] o Mo[place], MD; Kristi Fleenor, NP; West, MD o [address], [place], [place] [address] o [phone] o Mon-Fri 8:00-5:30, Sat&Sun by appointment (phones open at 8:30) o Babies seen by [facility] providers o Accepting Medicaid o Must be a first-time baby or sibling of current patient . [facility] [place]  o [address], [place], [place]  [address] o [phone]   Fax - [phone]  [place] ([address]) . [facility] o Brown, PA; Cowen, PA; Rice, MD; Helton, PA; Spry, MD o [address] [place], [place] [address] o [phone] o Mon-Thur 8:00-7:00, Fri 8:00-5:00, Sat 8:00-12:00, Sun 9:00-12:00 o Babies seen by [facility] providers o Accepting Medicaid . [facility] o Coe-Goins, MD; Marshall, MD; Pierre-Louis, MD o [address], [place], [place] [address] o [phone] o Mon-Thur 8:00-5:00 o Babies seen by providers at [facility] o Accepting Medicaid . [facility] o Bratton, MD; Coe-Goins, MD; Hayes, MD; Lewis, MD; List, MD; Lott, MD; Marshall, MD; Moran, MD; O'Neal, MD; Pierre-Louis, MD; Pitonzo, MD; Scholer, MD; Spangle, MD o [address] [place],    [address] o [phone] o Mon-Fri 8:00-5:30, Sat (Oct.-Mar.) 9:00-1:00 o Babies seen by providers at [facility] o Accepting Medicaid o Must fill out new patient packet, available online at [email or web address] . [facility] ([facility] o Friddle, NP; Harris, NP; Kelly, NP; Logan, MD;  Melvin, PA; Poth, MD; Ramadoss, MD; Stanton, NP o [address], [place], [place] [address] o [phone] o Mon-Thur 8:00-5:30, Fri 8:00-5:00 o Babies seen by providers at [facility] o Accepting Medicaid  [place] ([address]) . [facility] o Dixon, PA; Rosamond, MD; Pickard, MD; Tapia, PA o [address], [place], [place] [address] o [phone] o Mon-Fri 8:00-5:00 o Babies seen by providers at [facility] o Accepting Medicaid   [place] ([address]) . [facility] [place] o Masneri, DO; Meyers, MD; Nelson, PA o [address], [place], [place] [address] o [phone] o Mon-Fri 8:00-5:00 o Babies seen by providers at [facility] o Does NOT accept Medicaid o Limited appointment availability, please call early in hospitalization  . [facility] [place] o Kunedd, DO; McGowen, MD o [address], [place], [place] [address] o [phone] o Mon-Fri 8:00-5:00 o Babies seen by [facility] providers o Does NOT accept Medicaid . [facility] [place] o Cameron, MD; MacDonald, MD; Michaels, PA; Nayak, MD o [address] [place] [address], [place], [place] [address] o [phone] o Mon-Fri 8:00-5:00 o After hours clinic ([address] [place], [place] [address]) [phone] Mon-Fri 5:00-8:00, Sat 12:00-6:00, Sun 10:00-4:00 o Babies seen by [facility] providers o Accepting Medicaid . [facility] [place] o [address], [place], [place]  [address] o [phone]   Fax - [phone]  [place] ([address]) . [facility] [place] [facility] o Andy, MD o [address], [place], [place] [address] o [phone] o Mon-Fri 8:00-5:00 o Babies seen by [facility] providers o Does NOT accept Medicaid . [facility] [place] ([facility] [place]) o Eksir, MD o [address], [place], [place]  [address] o [phone] o Mon-Thur 8:00-7:00, Fri 8:00-5:00, Sat 8:00-12:00 o Babies seen by providers at [facility] o Accepting Medicaid - but does not have vaccinations in office (must be received elsewhere) o Limited availability, please call early in hospitalization  [place] ([address]) . [facility]  o Charlene Flemming, MD o [address], [place]  [address] o [phone]  Fax [phone]   

## 2020-04-13 NOTE — Progress Notes (Signed)
Takes BP at home, just has not been logging them into baby script. States she did not know about it due to relocation. Educated on baby scripts and how to use the app. Will have email sent to her.   Bryson Dames., RN

## 2020-04-20 ENCOUNTER — Ambulatory Visit: Payer: Medicaid Other | Attending: Obstetrics and Gynecology

## 2020-04-20 ENCOUNTER — Other Ambulatory Visit: Payer: Self-pay

## 2020-04-20 ENCOUNTER — Ambulatory Visit: Payer: Medicaid Other | Admitting: *Deleted

## 2020-04-20 ENCOUNTER — Other Ambulatory Visit: Payer: Self-pay | Admitting: *Deleted

## 2020-04-20 DIAGNOSIS — O34219 Maternal care for unspecified type scar from previous cesarean delivery: Secondary | ICD-10-CM | POA: Diagnosis not present

## 2020-04-20 DIAGNOSIS — O09899 Supervision of other high risk pregnancies, unspecified trimester: Secondary | ICD-10-CM | POA: Insufficient documentation

## 2020-04-20 DIAGNOSIS — O09213 Supervision of pregnancy with history of pre-term labor, third trimester: Secondary | ICD-10-CM

## 2020-04-20 DIAGNOSIS — O0990 Supervision of high risk pregnancy, unspecified, unspecified trimester: Secondary | ICD-10-CM | POA: Insufficient documentation

## 2020-04-20 DIAGNOSIS — O09293 Supervision of pregnancy with other poor reproductive or obstetric history, third trimester: Secondary | ICD-10-CM | POA: Diagnosis not present

## 2020-04-20 DIAGNOSIS — O359XX0 Maternal care for (suspected) fetal abnormality and damage, unspecified, not applicable or unspecified: Secondary | ICD-10-CM | POA: Diagnosis not present

## 2020-04-20 DIAGNOSIS — Z362 Encounter for other antenatal screening follow-up: Secondary | ICD-10-CM | POA: Diagnosis not present

## 2020-04-20 DIAGNOSIS — Z8759 Personal history of other complications of pregnancy, childbirth and the puerperium: Secondary | ICD-10-CM

## 2020-04-20 DIAGNOSIS — Z3A31 31 weeks gestation of pregnancy: Secondary | ICD-10-CM

## 2020-04-27 ENCOUNTER — Ambulatory Visit (INDEPENDENT_AMBULATORY_CARE_PROVIDER_SITE_OTHER): Payer: Medicaid Other | Admitting: Family Medicine

## 2020-04-27 ENCOUNTER — Other Ambulatory Visit: Payer: Self-pay

## 2020-04-27 VITALS — BP 101/70 | HR 75 | Wt 122.0 lb

## 2020-04-27 DIAGNOSIS — Z8759 Personal history of other complications of pregnancy, childbirth and the puerperium: Secondary | ICD-10-CM

## 2020-04-27 DIAGNOSIS — O09899 Supervision of other high risk pregnancies, unspecified trimester: Secondary | ICD-10-CM

## 2020-04-27 DIAGNOSIS — O0990 Supervision of high risk pregnancy, unspecified, unspecified trimester: Secondary | ICD-10-CM

## 2020-04-27 DIAGNOSIS — Z98891 History of uterine scar from previous surgery: Secondary | ICD-10-CM

## 2020-04-27 NOTE — Progress Notes (Signed)
   PRENATAL VISIT NOTE  Subjective:  Brandi Lee is a 24 y.o. G2P0101 at [redacted]w[redacted]d being seen today for ongoing prenatal care.  She is currently monitored for the following issues for this low-risk pregnancy and has Supervision of high risk pregnancy, antepartum; History of preterm delivery, currently pregnant; History of cesarean section; and History of prior pregnancy with IUGR newborn on their problem list.  Patient reports no complaints.  Contractions: Not present. Vag. Bleeding: None.  Movement: Present. Denies leaking of fluid.   The following portions of the patient's history were reviewed and updated as appropriate: allergies, current medications, past family history, past medical history, past social history, past surgical history and problem list.   Objective:   Vitals:   04/27/20 1629  BP: 101/70  Pulse: 75  Weight: 122 lb (55.3 kg)    Fetal Status:     Movement: Present     General:  Alert, oriented and cooperative. Patient is in no acute distress.  Skin: Skin is warm and dry. No rash noted.   Cardiovascular: Normal heart rate noted  Respiratory: Normal respiratory effort, no problems with respiration noted  Abdomen: Soft, gravid, appropriate for gestational age.  Pain/Pressure: Present     Pelvic: Cervical exam deferred        Extremities: Normal range of motion.  Edema: Trace  Mental Status: Normal mood and affect. Normal behavior. Normal judgment and thought content.   Assessment and Plan:  Pregnancy: G2P0101 at [redacted]w[redacted]d  Supervision of high risk pregnancy, antepartum -Doing well without concerns. -Plan for C/S at 39 weeks. Follow up in 2 weeks.   History of cesarean section  History of preterm delivery, currently pregnant [redacted]w[redacted]d. PTL. DFM. Nuchal x4.  History of IUGR in G1 -FH 30cm, appropriate for GA -Serial growth scans, upcoming u/s 05/18/20  Preterm labor symptoms and general obstetric precautions including but not limited to vaginal bleeding, contractions,  leaking of fluid and fetal movement were reviewed in detail with the patient. Please refer to After Visit Summary for other counseling recommendations.   No follow-ups on file.  Future Appointments  Date Time Provider Department Center  05/11/2020  3:55 PM Adam Phenix, MD [facility]  05/18/2020  9:00 AM [facility] NURSE [facility]  05/18/2020  9:15 AM [facility] US2 [facility]    Alric Seton, MD

## 2020-05-11 ENCOUNTER — Encounter: Payer: Medicaid Other | Admitting: Obstetrics & Gynecology

## 2020-05-18 ENCOUNTER — Ambulatory Visit: Payer: Medicaid Other | Attending: Obstetrics and Gynecology

## 2020-05-18 ENCOUNTER — Other Ambulatory Visit: Payer: Self-pay

## 2020-05-18 ENCOUNTER — Ambulatory Visit: Payer: Medicaid Other | Admitting: *Deleted

## 2020-05-18 ENCOUNTER — Encounter: Payer: Self-pay | Admitting: Obstetrics and Gynecology

## 2020-05-18 ENCOUNTER — Ambulatory Visit (INDEPENDENT_AMBULATORY_CARE_PROVIDER_SITE_OTHER): Payer: Medicaid Other | Admitting: Obstetrics and Gynecology

## 2020-05-18 VITALS — BP 97/62 | HR 80 | Wt 124.5 lb

## 2020-05-18 DIAGNOSIS — O09899 Supervision of other high risk pregnancies, unspecified trimester: Secondary | ICD-10-CM | POA: Diagnosis present

## 2020-05-18 DIAGNOSIS — O09293 Supervision of pregnancy with other poor reproductive or obstetric history, third trimester: Secondary | ICD-10-CM | POA: Diagnosis not present

## 2020-05-18 DIAGNOSIS — Z3A35 35 weeks gestation of pregnancy: Secondary | ICD-10-CM | POA: Diagnosis not present

## 2020-05-18 DIAGNOSIS — O34219 Maternal care for unspecified type scar from previous cesarean delivery: Secondary | ICD-10-CM

## 2020-05-18 DIAGNOSIS — O0990 Supervision of high risk pregnancy, unspecified, unspecified trimester: Secondary | ICD-10-CM

## 2020-05-18 DIAGNOSIS — Z98891 History of uterine scar from previous surgery: Secondary | ICD-10-CM

## 2020-05-18 DIAGNOSIS — Z8759 Personal history of other complications of pregnancy, childbirth and the puerperium: Secondary | ICD-10-CM | POA: Diagnosis not present

## 2020-05-18 DIAGNOSIS — O359XX0 Maternal care for (suspected) fetal abnormality and damage, unspecified, not applicable or unspecified: Secondary | ICD-10-CM | POA: Diagnosis not present

## 2020-05-18 NOTE — Progress Notes (Signed)
   PRENATAL VISIT NOTE  Subjective:  Brandi Lee is a 24 y.o. G2P0101 at [redacted]w[redacted]d being seen today for ongoing prenatal care.  She is currently monitored for the following issues for this high-risk pregnancy and has Supervision of high risk pregnancy, antepartum; History of preterm delivery, currently pregnant; History of cesarean section; History of prior pregnancy with IUGR newborn; and [redacted] weeks gestation of pregnancy on their problem list.  Patient doing well with no acute concerns today. She reports no complaints.  Contractions: Irritability. Vag. Bleeding: None.  Movement: Present. Denies leaking of fluid.   Pt is doing well.  Discussed flu shot.  Pt is unsure and would be more comfortable getting her immunization in a week.    The following portions of the patient's history were reviewed and updated as appropriate: allergies, current medications, past family history, past medical history, past social history, past surgical history and problem list. Problem list updated.  Objective:   Vitals:   05/18/20 0822  BP: 97/62  Pulse: 80  Weight: 124 lb 8 oz (56.5 kg)    Fetal Status: Fetal Heart Rate (bpm): 144 Fundal Height: 34 cm Movement: Present     General:  Alert, oriented and cooperative. Patient is in no acute distress.  Skin: Skin is warm and dry. No rash noted.   Cardiovascular: Normal heart rate noted  Respiratory: Normal respiratory effort, no problems with respiration noted  Abdomen: Soft, gravid, appropriate for gestational age.  Pain/Pressure: Present     Pelvic: Cervical exam deferred        Extremities: Normal range of motion.  Edema: Trace  Mental Status:  Normal mood and affect. Normal behavior. Normal judgment and thought content.   Assessment and Plan:  Pregnancy: G2P0101 at [redacted]w[redacted]d  1. Supervision of high risk pregnancy, antepartum Schedule repeat c section at 39 weeks unless changed per MFM guidance Flu shot and 36 week labs at next visit  2. History of preterm  delivery, currently pregnant No s/sx of PTL  3. History of cesarean section Schedule repeat section, pt desires progesterone IUD at time of c section  4. History of prior pregnancy with IUGR newborn Pt has growth scan with MFM today  5. [redacted] weeks gestation of pregnancy   Preterm labor symptoms and general obstetric precautions including but not limited to vaginal bleeding, contractions, leaking of fluid and fetal movement were reviewed in detail with the patient.  Please refer to After Visit Summary for other counseling recommendations.   Return in about 1 week (around 05/25/2020) for [facility], in person, 36 weeks swabs.   Mariel Aloe, MD

## 2020-05-18 NOTE — Patient Instructions (Signed)
Cesarean Delivery Cesarean birth, or cesarean delivery, is the surgical delivery of a baby through an incision in the abdomen and the uterus. This may be referred to as a C-section. This procedure may be scheduled ahead of time, or it may be done in an emergency situation. Tell a health care provider about:  Any allergies you have.  All medicines you are taking, including vitamins, herbs, eye drops, creams, and over-the-counter medicines.  Any problems you or family members have had with anesthetic medicines.  Any blood disorders you have.  Any surgeries you have had.  Any medical conditions you have.  Whether you or any members of your family have a history of deep vein thrombosis (DVT) or pulmonary embolism (PE). What are the risks? Generally, this is a safe procedure. However, problems may occur, including:  Infection.  Bleeding.  Allergic reactions to medicines.  Damage to other structures or organs.  Blood clots.  Injury to your baby. What happens before the procedure? General instructions  Follow instructions from your health care provider about eating or drinking restrictions.  If you know that you are going to have a cesarean delivery, do not shave your pubic area. Shaving before the procedure may increase your risk of infection.  Plan to have someone take you home from the hospital.  Ask your health care provider what steps will be taken to prevent infection. These may include: ? Removing hair at the surgery site. ? Washing skin with a germ-killing soap. ? Taking antibiotic medicine.  Depending on the reason for your cesarean delivery, you may have a physical exam or additional testing, such as an ultrasound.  You may have your blood or urine tested. Questions for your health care provider  Ask your health care provider about: ? Changing or stopping your regular medicines. This is especially important if you are taking diabetes medicines or blood  thinners. ? Your pain management plan. This is especially important if you plan to breastfeed your baby. ? How long you will be in the hospital after the procedure. ? Any concerns you may have about receiving blood products, if you need them during the procedure. ? Cord blood banking, if you plan to collect your baby's umbilical cord blood.  You may also want to ask your health care provider: ? Whether you will be able to hold or breastfeed your baby while you are still in the operating room. ? Whether your baby can stay with you immediately after the procedure and during your recovery. ? Whether a family member or a person of your choice can go with you into the operating room and stay with you during the procedure, immediately after the procedure, and during your recovery. What happens during the procedure?   An IV will be inserted into one of your veins.  Fluid and medicines, such as antibiotics, will be given before the surgery.  Fetal monitors will be placed on your abdomen to check your baby's heart rate.  You may be given a special warming gown to wear to keep your temperature stable.  A catheter may be inserted into your bladder through your urethra. This drains your urine during the procedure.  You may be given one or more of the following: ? A medicine to numb the area (local anesthetic). ? A medicine to make you fall asleep (general anesthetic). ? A medicine (regional anesthetic) that is injected into your back or through a small thin tube placed in your back (spinal anesthetic or epidural  anesthetic). This numbs everything below the injection site and allows you to stay awake during your procedure. If this makes you feel nauseous, tell your health care provider. Medicines will be available to help reduce any nausea you may feel.  An incision will be made in your abdomen, and then in your uterus.  If you are awake during your procedure, you may feel tugging and pulling in  your abdomen, but you should not feel pain. If you feel pain, tell your health care provider immediately.  Your baby will be removed from your uterus. You may feel more pressure or pushing while this happens.  Immediately after birth, your baby will be dried and kept warm. You may be able to hold and breastfeed your baby.  The umbilical cord may be clamped and cut during this time. This usually occurs after waiting a period of 1-2 minutes after delivery.  Your placenta will be removed from your uterus.  Your incisions will be closed with stitches (sutures). Staples, skin glue, or adhesive strips may also be applied to the incision in your abdomen.  Bandages (dressings) may be placed over the incision in your abdomen. The procedure may vary among health care providers and hospitals. What happens after the procedure?  Your blood pressure, heart rate, breathing rate, and blood oxygen level will be monitored until you are discharged from the hospital.  You may continue to receive fluids and medicines through an IV.  You will have some pain. Medicines will be available to help control your pain.  To help prevent blood clots: ? You may be given medicines. ? You may have to wear compression stockings or devices. ? You will be encouraged to walk around when you are able.  Hospital staff will encourage and support bonding with your baby. Your hospital may have you and your baby to stay in the same room (rooming in) during your hospital stay to encourage successful bonding and breastfeeding.  You may be encouraged to cough and breathe deeply often. This helps to prevent lung problems.  If you have a catheter draining your urine, it will be removed as soon as possible after your procedure. Summary  Cesarean birth, or cesarean delivery, is the surgical delivery of a baby through an incision in the abdomen and the uterus.  Follow instructions from your health care provider about eating or  drinking restrictions before the procedure.  You will have some pain after the procedure. Medicines will be available to help control your pain.  Hospital staff will encourage and support bonding with your baby after the procedure. Your hospital may have you and your baby to stay in the same room (rooming in) during your hospital stay to encourage successful bonding and breastfeeding. This information is not intended to replace advice given to you by your health care provider. Make sure you discuss any questions you have with your health care provider. Document Revised: 02/24/2018 Document Reviewed: 02/24/2018 Elsevier Patient Education  2020 [employer].  Third Trimester of Pregnancy  The third trimester is from week 28 through week 40 (months 7 through 9). This trimester is when your unborn baby (fetus) is growing very fast. At the end of the ninth month, the unborn baby is about 20 inches in length. It weighs about 6-10 pounds. Follow these instructions at home: Medicines  Take over-the-counter and prescription medicines only as told by your doctor. Some medicines are safe and some medicines are not safe during pregnancy.  Take a prenatal vitamin that  contains at least 600 micrograms (mcg) of folic acid.  If you have trouble pooping (constipation), take medicine that will make your stool soft (stool softener) if your doctor approves. Eating and drinking   Eat regular, healthy meals.  Avoid raw meat and uncooked cheese.  If you get low calcium from the food you eat, talk to your doctor about taking a daily calcium supplement.  Eat four or five small meals rather than three large meals a day.  Avoid foods that are high in fat and sugars, such as fried and sweet foods.  To prevent constipation: ? Eat foods that are high in fiber, like fresh fruits and vegetables, whole grains, and beans. ? Drink enough fluids to keep your pee (urine) clear or pale yellow. Activity  Exercise  only as told by your doctor. Stop exercising if you start to have cramps.  Avoid heavy lifting, wear low heels, and sit up straight.  Do not exercise if it is too hot, too humid, or if you are in a place of great height (high altitude).  You may continue to have sex unless your doctor tells you not to. Relieving pain and discomfort  Wear a good support bra if your breasts are tender.  Take frequent breaks and rest with your legs raised if you have leg cramps or low back pain.  Take warm water baths (sitz baths) to soothe pain or discomfort caused by hemorrhoids. Use hemorrhoid cream if your doctor approves.  If you develop puffy, bulging veins (varicose veins) in your legs: ? Wear support hose or compression stockings as told by your doctor. ? Raise (elevate) your feet for 15 minutes, 3-4 times a day. ? Limit salt in your food. Safety  Wear your seat belt when driving.  Make a list of emergency phone numbers, including numbers for family, friends, the hospital, and police and fire departments. Preparing for your baby's arrival To prepare for the arrival of your baby:  Take prenatal classes.  Practice driving to the hospital.  Visit the hospital and tour the maternity area.  Talk to your work about taking leave once the baby comes.  Pack your hospital bag.  Prepare the baby's room.  Go to your doctor visits.  Buy a rear-facing car seat. Learn how to install it in your car. General instructions  Do not use hot tubs, steam rooms, or saunas.  Do not use any products that contain nicotine or tobacco, such as cigarettes and e-cigarettes. If you need help quitting, ask your doctor.  Do not drink alcohol.  Do not douche or use tampons or scented sanitary pads.  Do not cross your legs for long periods of time.  Do not travel for long distances unless you must. Only do so if your doctor says it is okay.  Visit your dentist if you have not gone during your pregnancy. Use  a soft toothbrush to brush your teeth. Be gentle when you floss.  Avoid cat litter boxes and soil used by cats. These carry germs that can cause birth defects in the baby and can cause a loss of your baby (miscarriage) or stillbirth.  Keep all your prenatal visits as told by your doctor. This is important. Contact a doctor if:  You are not sure if you are in labor or if your water has broken.  You are dizzy.  You have mild cramps or pressure in your lower belly.  You have a nagging pain in your belly area.  You continue  to feel sick to your stomach, you throw up, or you have watery poop.  You have bad smelling fluid coming from your vagina.  You have pain when you pee. Get help right away if:  You have a fever.  You are leaking fluid from your vagina.  You are spotting or bleeding from your vagina.  You have severe belly cramps or pain.  You lose or gain weight quickly.  You have trouble catching your breath and have chest pain.  You notice sudden or extreme puffiness (swelling) of your face, hands, ankles, feet, or legs.  You have not felt the baby move in over an hour.  You have severe headaches that do not go away with medicine.  You have trouble seeing.  You are leaking, or you are having a gush of fluid, from your vagina before you are 37 weeks.  You have regular belly spasms (contractions) before you are 37 weeks. Summary  The third trimester is from week 28 through week 40 (months 7 through 9). This time is when your unborn baby is growing very fast.  Follow your doctor's advice about medicine, food, and activity.  Get ready for the arrival of your baby by taking prenatal classes, getting all the baby items ready, preparing the baby's room, and visiting your doctor to be checked.  Get help right away if you are bleeding from your vagina, or you have chest pain and trouble catching your breath, or if you have not felt your baby move in over an hour. This  information is not intended to replace advice given to you by your health care provider. Make sure you discuss any questions you have with your health care provider. Document Revised: 12/11/2018 Document Reviewed: 09/25/2016 Elsevier Patient Education  2020 [employer].

## 2020-05-25 ENCOUNTER — Ambulatory Visit (INDEPENDENT_AMBULATORY_CARE_PROVIDER_SITE_OTHER): Payer: Medicaid Other | Admitting: Obstetrics and Gynecology

## 2020-05-25 ENCOUNTER — Encounter: Payer: Self-pay | Admitting: Obstetrics and Gynecology

## 2020-05-25 ENCOUNTER — Other Ambulatory Visit: Payer: Self-pay

## 2020-05-25 ENCOUNTER — Other Ambulatory Visit (HOSPITAL_COMMUNITY)
Admission: RE | Admit: 2020-05-25 | Discharge: 2020-05-25 | Disposition: A | Discharge status: Home / Self Care | Payer: Medicaid Other | Source: Ambulatory Visit | Attending: Obstetrics and Gynecology | Admitting: Obstetrics and Gynecology

## 2020-05-25 VITALS — BP 113/66 | HR 86 | Wt 126.2 lb

## 2020-05-25 DIAGNOSIS — Z98891 History of uterine scar from previous surgery: Secondary | ICD-10-CM

## 2020-05-25 DIAGNOSIS — O0990 Supervision of high risk pregnancy, unspecified, unspecified trimester: Secondary | ICD-10-CM | POA: Insufficient documentation

## 2020-05-25 DIAGNOSIS — O09899 Supervision of other high risk pregnancies, unspecified trimester: Secondary | ICD-10-CM

## 2020-05-25 DIAGNOSIS — Z8759 Personal history of other complications of pregnancy, childbirth and the puerperium: Secondary | ICD-10-CM

## 2020-05-25 NOTE — Progress Notes (Signed)
Subjective:  Brandi Lee is a 24 y.o. G2P0101 at [redacted]w[redacted]d being seen today for ongoing prenatal care.  She is currently monitored for the following issues for this high-risk pregnancy and has Supervision of high risk pregnancy, antepartum; History of preterm delivery, currently pregnant; History of cesarean section; History of prior pregnancy with IUGR newborn; and [redacted] weeks gestation of pregnancy on their problem list.  Patient reports no complaints.  Contractions: Not present. Vag. Bleeding: None.  Movement: Present. Denies leaking of fluid.   The following portions of the patient's history were reviewed and updated as appropriate: allergies, current medications, past family history, past medical history, past social history, past surgical history and problem list. Problem list updated.  Objective:   Vitals:   05/25/20 1426  BP: 113/66  Pulse: 86  Weight: 126 lb 3.2 oz (57.2 kg)    Fetal Status: Fetal Heart Rate (bpm): 144   Movement: Present     General:  Alert, oriented and cooperative. Patient is in no acute distress.  Skin: Skin is warm and dry. No rash noted.   Cardiovascular: Normal heart rate noted  Respiratory: Normal respiratory effort, no problems with respiration noted  Abdomen: Soft, gravid, appropriate for gestational age. Pain/Pressure: Present     Pelvic:  Cervical exam deferred        Extremities: Normal range of motion.  Edema: Trace  Mental Status: Normal mood and affect. Normal behavior. Normal judgment and thought content.   Urinalysis:      Assessment and Plan:  Pregnancy: G2P0101 at [redacted]w[redacted]d  1. Supervision of high risk pregnancy, antepartum Labor precautions - GC/Chlamydia probe amp ()not at [facility] - Culture, beta strep (group b only)  2. History of preterm delivery, currently pregnant No evidence and now term  3. History of prior pregnancy with IUGR newborn Growth 31 % on 05/18/20 No further scans as per MFM  4. History of cesarean section For  repeat with IUD placement 06/16/20  Term labor symptoms and general obstetric precautions including but not limited to vaginal bleeding, contractions, leaking of fluid and fetal movement were reviewed in detail with the patient. Please refer to After Visit Summary for other counseling recommendations.  Return in about 1 week (around 06/01/2020) for OB visit, face to face, MD only.   Hermina Staggers, MD

## 2020-05-25 NOTE — Patient Instructions (Signed)
Cesarean Delivery, Care After This sheet gives you information about how to care for yourself after your procedure. Your health care provider may also give you more specific instructions. If you have problems or questions, contact your health care provider. What can I expect after the procedure? After the procedure, it is common to have:  A small amount of blood or clear fluid coming from the incision.  Some redness, swelling, and pain in your incision area.  Some abdominal pain and soreness.  Vaginal bleeding (lochia). Even though you did not have a vaginal delivery, you will still have vaginal bleeding and discharge.  Pelvic cramps.  Fatigue. You may have pain, swelling, and discomfort in the tissue between your vagina and your anus (perineum) if:  Your C-section was unplanned, and you were allowed to labor and push.  An incision was made in the area (episiotomy) or the tissue tore during attempted vaginal delivery. Follow these instructions at home: Incision care   Follow instructions from your health care provider about how to take care of your incision. Make sure you: ? Wash your hands with soap and water before you change your bandage (dressing). If soap and water are not available, use hand sanitizer. ? If you have a dressing, change it or remove it as told by your health care provider. ? Leave stitches (sutures), skin staples, skin glue, or adhesive strips in place. These skin closures may need to stay in place for 2 weeks or longer. If adhesive strip edges start to loosen and curl up, you may trim the loose edges. Do not remove adhesive strips completely unless your health care provider tells you to do that.  Check your incision area every day for signs of infection. Check for: ? More redness, swelling, or pain. ? More fluid or blood. ? Warmth. ? Pus or a bad smell.  Do not take baths, swim, or use a hot tub until your health care provider says it's okay. Ask your health  care provider if you can take showers.  When you cough or sneeze, hug a pillow. This helps with pain and decreases the chance of your incision opening up (dehiscing). Do this until your incision heals. Medicines  Take over-the-counter and prescription medicines only as told by your health care provider.  If you were prescribed an antibiotic medicine, take it as told by your health care provider. Do not stop taking the antibiotic even if you start to feel better.  Do not drive or use heavy machinery while taking prescription pain medicine. Lifestyle  Do not drink alcohol. This is especially important if you are breastfeeding or taking pain medicine.  Do not use any products that contain nicotine or tobacco, such as cigarettes, e-cigarettes, and chewing tobacco. If you need help quitting, ask your health care provider. Eating and drinking  Drink at least 8 eight-ounce glasses of water every day unless told not to by your health care provider. If you breastfeed, you may need to drink even more water.  Eat high-fiber foods every day. These foods may help prevent or relieve constipation. High-fiber foods include: ? Whole grain cereals and breads. ? Brown rice. ? Beans. ? Fresh fruits and vegetables. Activity   If possible, have someone help you care for your baby and help with household activities for at least a few days after you leave the hospital.  Return to your normal activities as told by your health care provider. Ask your health care provider what activities are safe for   you.  Rest as much as possible. Try to rest or take a nap while your baby is sleeping.  Do not lift anything that is heavier than 10 lbs (4.5 kg), or the limit that you were told, until your health care provider says that it is safe.  Talk with your health care provider about when you can engage in sexual activity. This may depend on your: ? Risk of infection. ? How fast you heal. ? Comfort and desire to  engage in sexual activity. General instructions  Do not use tampons or douches until your health care provider approves.  Wear loose, comfortable clothing and a supportive and well-fitting bra.  Keep your perineum clean and dry. Wipe from front to back when you use the toilet.  If you pass a blood clot, save it and call your health care provider to discuss. Do not flush blood clots down the toilet before you get instructions from your health care provider.  Keep all follow-up visits for you and your baby as told by your health care provider. This is important. Contact a health care provider if:  You have: ? A fever. ? Bad-smelling vaginal discharge. ? Pus or a bad smell coming from your incision. ? Difficulty or pain when urinating. ? A sudden increase or decrease in the frequency of your bowel movements. ? More redness, swelling, or pain around your incision. ? More fluid or blood coming from your incision. ? A rash. ? Nausea. ? Little or no interest in activities you used to enjoy. ? Questions about caring for yourself or your baby.  Your incision feels warm to the touch.  Your breasts turn red or become painful or hard.  You feel unusually sad or worried.  You vomit.  You pass a blood clot from your vagina.  You urinate more than usual.  You are dizzy or light-headed. Get help right away if:  You have: ? Pain that does not go away or get better with medicine. ? Chest pain. ? Difficulty breathing. ? Blurred vision or spots in your vision. ? Thoughts about hurting yourself or your baby. ? New pain in your abdomen or in one of your legs. ? A severe headache.  You faint.  You bleed from your vagina so much that you fill more than one sanitary pad in one hour. Bleeding should not be heavier than your heaviest period. Summary  After the procedure, it is common to have pain at your incision site, abdominal cramping, and slight bleeding from your vagina.  Check  your incision area every day for signs of infection.  Tell your health care provider about any unusual symptoms.  Keep all follow-up visits for you and your baby as told by your health care provider. This information is not intended to replace advice given to you by your health care provider. Make sure you discuss any questions you have with your health care provider. Document Revised: 02/26/2018 Document Reviewed: 02/26/2018 Elsevier Patient Education  2020 [employer]  

## 2020-05-26 LAB — GC/CHLAMYDIA PROBE AMP (~~LOC~~) NOT AT ARMC
Chlamydia: NEGATIVE
Comment: NEGATIVE
Comment: NORMAL
Neisseria Gonorrhea: NEGATIVE

## 2020-05-28 LAB — CULTURE, BETA STREP (GROUP B ONLY): Strep Gp B Culture: NEGATIVE

## 2020-06-01 ENCOUNTER — Ambulatory Visit (INDEPENDENT_AMBULATORY_CARE_PROVIDER_SITE_OTHER): Payer: Medicaid Other | Admitting: Obstetrics & Gynecology

## 2020-06-01 ENCOUNTER — Other Ambulatory Visit: Payer: Self-pay

## 2020-06-01 VITALS — BP 110/67 | HR 87 | Wt 128.2 lb

## 2020-06-01 DIAGNOSIS — O09899 Supervision of other high risk pregnancies, unspecified trimester: Secondary | ICD-10-CM

## 2020-06-01 DIAGNOSIS — O34219 Maternal care for unspecified type scar from previous cesarean delivery: Secondary | ICD-10-CM

## 2020-06-01 DIAGNOSIS — Z98891 History of uterine scar from previous surgery: Secondary | ICD-10-CM

## 2020-06-01 DIAGNOSIS — O0990 Supervision of high risk pregnancy, unspecified, unspecified trimester: Secondary | ICD-10-CM

## 2020-06-01 DIAGNOSIS — Z8759 Personal history of other complications of pregnancy, childbirth and the puerperium: Secondary | ICD-10-CM

## 2020-06-01 NOTE — Progress Notes (Signed)
Here for routine ob fu. Declines flu shot. Angelica Wix,RN

## 2020-06-01 NOTE — Progress Notes (Signed)
  Patient ID: Brandi Lee, female   DOB: [birth year], 24 y.o.   MRN: [id number]   PRENATAL VISIT NOTE  Subjective:  Brandi Lee is a 24 y.o. G2P0101 at [redacted]w[redacted]d being seen today for ongoing prenatal care.  She is currently monitored for the following issues for this high-risk pregnancy and has Supervision of high risk pregnancy, antepartum; History of preterm delivery, currently pregnant; History of cesarean section; History of prior pregnancy with IUGR newborn; and [redacted] weeks gestation of pregnancy on their problem list.  Patient reports no bleeding, no cramping, no leaking, occasional contractions and intermittnet pressure.  Contractions: Irregular. Vag. Bleeding: None.  Movement: Present. Denies leaking of fluid.   The following portions of the patient's history were reviewed and updated as appropriate: allergies, current medications, past family history, past medical history, past social history, past surgical history and problem list.   Objective:   Vitals:   06/01/20 1313  BP: 110/67  Pulse: 87  Weight: 128 lb 3.2 oz (58.2 kg)    Fetal Status: Fetal Heart Rate (bpm): 132   Movement: Present     General:  Alert, oriented and cooperative. Patient is in no acute distress.  Skin: Skin is warm and dry. No rash noted.   Cardiovascular: Normal heart rate noted  Respiratory: Normal respiratory effort, no problems with respiration noted  Abdomen: Soft, gravid, appropriate for gestational age.  Pain/Pressure: Present     Pelvic: Cervical exam deferred        Extremities: Normal range of motion.  Edema: None  Mental Status: Normal mood and affect. Normal behavior. Normal judgment and thought content.   Assessment and Plan:  Pregnancy: G2P0101 at [redacted]w[redacted]d 1. Supervision of high risk pregnancy, antepartum Mother doing well. C/S scheduled for 10/14.   2. History of preterm delivery, currently pregnant   3. History of cesarean section ERCS scheduled  4. History of prior pregnancy with IUGR  newborn  Term labor symptoms and general obstetric precautions including but not limited to vaginal bleeding, contractions, leaking of fluid and fetal movement were reviewed in detail with the patient. Please refer to After Visit Summary for other counseling recommendations.   Return in 1 week (on 06/08/2020).  No future appointments.  Malachy Chamber, MD

## 2020-06-01 NOTE — Patient Instructions (Signed)
AREA PEDIATRIC/FAMILY PRACTICE PHYSICIANS  [place] ([address]) . [facility] o Chambliss, MD; Eniola, MD; Hale, MD; Hensel, MD; McDiarmid, MD; McIntyer, MD; Neal, MD; Walden, MD o [address] [place], [place] [address] o [phone] o Mon-Fri 8:30-12:30, 1:30-5:00 o Providers come to see babies at [facility] o Accepting Medicaid . [facility] o Limited providers who accept newborns: Koirala, MD; Morrow, MD; Wolters, MD o [address], [place], [place] [address] o [phone] o Mon-Fri 8:00-5:30 o Babies seen by providers at [facility] o Does NOT accept Medicaid o Please call early in hospitalization for appointment (limited availability)  . [facility] o Mulberry, MD o [address] [place], [place] [address] o [phone] o Mon, Tue, Thur, Fri 8:30-5:00, Wed 10:00-7:00 (closed 1-2pm) o Babies seen by [facility] providers o Accepting Medicaid . [facility] o Rubin, MD o [address], [place], [place] [address] o [phone] o Mon-Fri 8:30-5:00, Sat 8:30-12:00 o Provider comes to see babies at [facility] o Accepting Medicaid o Must have been referred from current patients or contacted office prior to delivery . [facility] ([facility]) o Brown, MD; Chandler, MD; Ettefagh, MD; Grant, MD; Lester, MD; McCormick, MD; McQueen, MD; Prose, MD; Simha, MD; Stanley, MD; Stryffeler, NP; Tebben, NP o [address], [place], [place] [address] o [phone] o Mon, Tue, Thur, Fri 8:30-5:30, Wed 9:30-5:30, Sat 8:30-12:30 o Babies seen by [facility] providers o Accepting Medicaid o Only accepting infants of first-time parents or siblings of current patients o Hospital discharge coordinator will make follow-up appointment . Jack Amos o [address],  [place], [place]  [address] o [phone]   Fax - [phone] . [facility] o [address], [place], [place]  [address] o Phone - [phone]   Fax - [phone] . Shilpa Gosrani o [address], [place], [place]  [address] o [phone]  [place] ([address]) . [facility] o Bates, MD; Brassfield, MD; Cooper, Cox, MD; MD; Davis, MD; Dovico, MD; Ettefaugh, MD; Little, MD; Lowe, MD; Keiffer, MD; Melvin, MD; Sumner, MD; Williams, MD o [address], [place], [place] [address] o [phone] o Mon-Fri 8:30-5:00 (extended evenings Mon-Thur as needed), Sat-Sun 10:00-1:00 o Providers come to see babies at [facility] o Accepting Medicaid for families of first-time babies and families with all children in the household age 3 and under. Must register with office prior to making appointment (M-F only). . [facility] o Henson, NP; Knapp, MD; Lalonde, MD; Tysinger, PA o [address] [place],  [address] o [phone] o Mon-Fri 8:00-5:00 o Babies seen by providers at [facility] o Does NOT accept Medicaid/Commercial Insurance Only . [facility] ([facility])  o Artis, MD; Barnes, MD; Bratton, MD; Coccaro, MD; [place] Gardner, MD; Kramer, MD; Marshall, MD; Netherton, MD; Poleto, MD; Skinner, MD o [address] [place],  [address] o [phone] o Mon-Fri 8:30-5:30, Sat (Oct.-Mar.) 9:00-1:00 o Babies seen by providers at [facility] o Accepting Medicaid  [place] ([address]) . [facility] o Reid, MD; Warner, MD o [address], [place],  [address] o [phone] o Mon-Fri 8:30-5:00, Sat 8:30-12:00 o Providers come to see babies at [facility] o Does NOT accept Medicaid . [facility] o Becker, PA; Hagler, MD; Scifres, PA; Sun, MD; Swayne, MD o [address],  [place], [place] [address] o [phone] o Mon-Fri 8:00-5:00 o Babies seen by providers at [facility] o Does NOT accept Medicaid o Only accepting babies of parents who are patients o Please call early in hospitalization for appointment (limited availability) . [facility] o Clark, MD; Frye, MD; Kelleher, MD; Mack, NP; Miller, MD; O'Keller, MD; Patterson, NP; Pudlo, MD; Puzio, MD; Thomas, MD; Tucker, MD; Twiselton, MD o [address], [place], [place] [address] o [phone] o Mon-Fri 8:00-5:00, Sat 9:00-12:00 o Providers come to see babies at [facility] o Does NOT accept Medicaid  [place] ([address]) . [facility] o Limited providers accepting new patients: Brake, NP; Wharton, PA o [address], [place], [place] [address] o [phone] o Mon-Fri 8:00-5:00 o Babies seen by providers at [facility] o Does NOT accept Medicaid o Only accepting babies of parents who are patients o Please call early in hospitalization for appointment (limited availability) . [facility] o Gay, MD; Quinlan, MD o [address] [place], [place] [address] o [phone] (press 1 to schedule appointment) o Mon-Fri 8:00-5:00 o Providers come to see babies at [facility] o Does NOT accept Medicaid . [facility] o Mazer, MD o [address] [place], [place] [address] o [phone] o Mon-Fri 8:30-5:00 (lunch 12:30-1:00), extended hours by appointment only Wed 5:00-6:30 o Babies seen by [facility] providers o Accepting Medicaid . [facility] o Banks, MD; Jordan, MD; Koberlein, MD o [address], [place], [place] [address] o [phone] o Mon-Fri 8:00-5:00 o Babies seen by [facility] providers o Does NOT accept Medicaid . [facility] o Parker, MD; Hunter, MD; Wallace, DO o [address] [place], [place]  [address] o [phone] o Mon-Fri 8:00-5:00 o Babies seen by [facility] providers o Does NOT accept Medicaid . [facility] o Brandon, PA; Brecken, PA; Christy, NP; Dees, MD; DeClaire, MD; DeWeese, MD; Hansen, NP; Mills, NP; Parrish, NP; Smoot, NP; Summer, MD; Vapne, MD o [address] [place], [place] [address] o [phone] o Mon-Fri 8:30-5:00, Sat 10:00-1:00 o Providers come to see babies at [facility] o Does NOT accept Medicaid o Free prenatal information session Tuesdays at 4:45pm . [facility] o Bouska, MD; Gordon, PA; Jeffery, PA; Weber, PA o [address] [place] [address] o [phone] o Mon-Fri 7:30-5:30 o Babies seen by [facility] providers . [facility] o [address], [place], [place]  [address] o [phone]   Fax - [phone]  [place] ([address] & [address]) . [facility] o [place], [place] o [address] [place], [place] [address] o [phone] o Mon-Thur 8:00-6:00 o Providers come to see babies at [facility] o Accepting Medicaid . [facility] o Anderson, NP; Badger, MD; Beal, PA; Spencer, PA o [address] [place], [place] [address] o [phone] o Mon-Thur 7:30-7:30, Fri 7:30-4:30 o Babies seen by [facility] providers o Accepting Medicaid . [facility] o Agbuya, MD; Klett, NP; Romgoolam, MD o [address], [place], [place] [address] o [phone] o Mon-Fri 8:30-5:00, Sat 8:30-12:00 o Providers come to see babies at [facility] o Accepting Medicaid o Must have "Meet & Greet" appointment at office prior to delivery . [facility] ([facility] o McCord,   MD; Wallace, MD; Wood, MD o [address], [place], [place] [address] o [phone] o Mon-Wed 8:00-6:00, Thur-Fri 8:00-5:00, Sat 9:00-12:00 o Providers come to  see babies at [facility] o Does NOT accept Medicaid o Only accepting siblings of current patients . [facility]  o [address], [place], [place]  [address] o [phone]   Fax - [phone] . [facility] o [address], [place], [place]  [address] o [phone]   Fax - [phone]  [place] ([address]) . [facility] o Cirigliano, DO; Matthews, DO o [address] [place], [place] [address] o [phone] o Mon-Fri 7:00-5:00 o Babies seen by [facility] providers o Does NOT accept Medicaid . [facility] o Briscoe, MD; Howley, PA; Moreira, PA o [address], [place], [place] [address] o [phone] o Mon-Fri 8:00-5:00 o Babies seen by [facility] providers o Accepting Medicaid . [facility] o Boyd, MD; Church, PA; Jones, NP; Osborn, PA o [address], [place], [place] [address] o [phone] o Mon-Fri 8:00-5:00 o Babies seen by providers at [facility] o Accepting Medicaid  [place] ([address]) .  [facility] [place] o Wend[place], DO o [address] [place], [place] [address] o [phone] o Mon-Fri 8:00-5:00 o Babies seen by [facility] providers o Does NOT accept Medicaid o Limited availability, please call early in hospitalization to schedule follow-up . [facility] o Calderon, PA; Cummings, MD; Dillard, MD; Martin, PA; Olson, MD; VanDeven, PA o [address], [place], [place] [address] o [phone] o Mon-Fri 8:30-5:00, Sat 9:00-12:00 o Babies seen by providers at [facility] o Accepting Medicaid o Please register online then schedule online or call office o [email or web address] . [facility] ([facility] o Hunter, NP; Kumar, MD; [place] Rogers, PA o [address], [place], [place] [address] o [phone] o Mon-Fri 8:00-5:00 o Babies seen by providers at [facility] o Accepting Medicaid . [facility] ([facility] o [place], MD; Kristi Fleenor, NP; West, MD o [address], [place], [place] [address] o [phone] o Mon-Fri 8:00-5:30, Sat&Sun by appointment (phones open at 8:30) o Babies seen by [facility] providers o Accepting Medicaid o Must be a first-time baby or sibling of current patient . [facility] [place]  o [address], [place], [place]  [address] o [phone]   Fax - [phone]  [place] ([address]) . [facility] o Brown, PA; Cowen, PA; Rice, MD; [place], PA; Spry, MD o [address] [place],  [address] o [phone] o Mon-Thur 8:00-7:00, Fri 8:00-5:00, Sat 8:00-12:00, Sun 9:00-12:00 o Babies seen by [facility] providers o Accepting Medicaid . [facility] o Coe-Goins, MD; [place], MD; Pierre-Louis, MD o [address], [place],  [address] o [phone] o Mon-Thur 8:00-5:00 o Babies seen by providers at [facility] o Accepting Medicaid . [facility] o Bratton, MD; Coe-Goins, MD; Hayes, MD; Lewis, MD; List, MD; Lott, MD; Marshall, MD; Moran, MD; O'Neal, MD; Pierre-Louis, MD; Pitonzo, MD; Scholer, MD; Spangle, MD o [address] [place],    [address] o [phone] o Mon-Fri 8:00-5:30, Sat (Oct.-Mar.) 9:00-1:00 o Babies seen by providers at [facility] o Accepting Medicaid o Must fill out new patient packet, available online at [email or web address] . [facility] ([facility] o Friddle, NP; Harris, NP; Kelly, NP; Logan, MD;  Melvin, PA; Poth, MD; Ramadoss, MD; Stanton, NP o [address], [place], [place] [address] o [phone] o Mon-Thur 8:00-5:30, Fri 8:00-5:00 o Babies seen by providers at [facility] o Accepting Medicaid  [place] ([address]) . [facility] o Dixon, PA; [place], MD; Pickard, MD; Tapia, PA o [address], [place], [place] [address] o [phone] o Mon-Fri 8:00-5:00 o Babies seen by providers at [facility] o Accepting Medicaid   [place] ([address]) . [facility] [place] o Masneri, DO; Meyers, MD; Nelson, PA o [address], [place], [place] [address] o [phone] o Mon-Fri 8:00-5:00 o Babies seen by providers at [facility] o Does NOT accept Medicaid o Limited appointment availability, please call early in hospitalization  . [facility] [place] o Kunedd, DO; McGowen, MD o [address], [place], [place] [address] o [phone] o Mon-Fri 8:00-5:00 o Babies seen by [facility] providers o Does NOT accept Medicaid . [facility] [place] o Cameron, MD; MacDonald, MD; Michaels, PA; Nayak, MD o [address] [place] [address], [place], [place] [address] o [phone] o Mon-Fri 8:00-5:00 o After hours clinic ([address] [place], [place] [address]) [phone] Mon-Fri 5:00-8:00, Sat 12:00-6:00, Sun 10:00-4:00 o Babies seen by [facility] providers o Accepting Medicaid . [facility] [place] o [address], [place], [place]  [address] o [phone]   Fax - [phone]  [place] ([address]) . [facility] [place] [facility] o Andy, MD o [address], [place], [place] [address] o [phone] o Mon-Fri 8:00-5:00 o Babies seen by [facility] providers o Does NOT accept Medicaid . [facility] [place] ([facility] [place]) o Eksir, MD o [address], [place], [place]  [address] o [phone] o Mon-Thur 8:00-7:00, Fri 8:00-5:00, Sat 8:00-12:00 o Babies seen by providers at [facility] o Accepting Medicaid - but does not have vaccinations in office (must be received elsewhere) o Limited availability, please call early in hospitalization  [place] ([address]) . [facility]  o Charlene Flemming, MD o [address], [place] [address] o [phone]  Fax [phone]   

## 2020-06-07 ENCOUNTER — Other Ambulatory Visit: Payer: Self-pay | Admitting: Obstetrics & Gynecology

## 2020-06-09 ENCOUNTER — Ambulatory Visit (INDEPENDENT_AMBULATORY_CARE_PROVIDER_SITE_OTHER): Payer: Medicaid Other | Admitting: Obstetrics & Gynecology

## 2020-06-09 ENCOUNTER — Encounter (HOSPITAL_COMMUNITY): Payer: Self-pay

## 2020-06-09 ENCOUNTER — Other Ambulatory Visit: Payer: Self-pay

## 2020-06-09 VITALS — BP 117/62 | HR 78 | Wt 130.2 lb

## 2020-06-09 DIAGNOSIS — O0990 Supervision of high risk pregnancy, unspecified, unspecified trimester: Secondary | ICD-10-CM

## 2020-06-09 DIAGNOSIS — Z98891 History of uterine scar from previous surgery: Secondary | ICD-10-CM

## 2020-06-09 NOTE — Patient Instructions (Signed)
Lennon Richins  06/09/2020   Your procedure is scheduled on:  10.14.2021  Arrive at 0730 at Entrance C on [employer] [facility]  and [employer]. You are invited to use the FREE valet parking or use the Visitor's parking deck.  Pick up the phone at the desk and dial [phone].  Call this number if you have problems the morning of surgery: [phone]  Remember:   Do not eat food:(After Midnight) Desps de medianoche.  Do not drink clear liquids: (After Midnight) Desps de medianoche.  Take these medicines the morning of surgery with A SIP OF WATER:  none   Do not wear jewelry, make-up or nail polish.  Do not wear lotions, powders, or perfumes. Do not wear deodorant.  Do not shave 48 hours prior to surgery.  Do not bring valuables to the hospital.  [facility] is not   responsible for any belongings or valuables brought to the hospital.  Contacts, dentures or bridgework may not be worn into surgery.  Leave suitcase in the car. After surgery it may be brought to your room.  For patients admitted to the hospital, checkout time is 11:00 AM the day of              discharge.      Please read over the following fact sheets that you were given:     Preparing for Surgery

## 2020-06-09 NOTE — Progress Notes (Signed)
   PRENATAL VISIT NOTE  Subjective:  Brandi Lee is a 24 y.o. G2P0101 at [redacted]w[redacted]d being seen today for ongoing prenatal care.  She is currently monitored for the following issues for this high-risk pregnancy and has Supervision of high risk pregnancy, antepartum; History of preterm delivery, currently pregnant; History of cesarean section; History of prior pregnancy with IUGR newborn; and [redacted] weeks gestation of pregnancy on their problem list.  Patient reports occasional contractions.  Contractions: Irritability. Vag. Bleeding: None.  Movement: Present. Denies leaking of fluid.   The following portions of the patient's history were reviewed and updated as appropriate: allergies, current medications, past family history, past medical history, past social history, past surgical history and problem list.   Objective:   Vitals:   06/09/20 1433  BP: 117/62  Pulse: 78  Weight: 130 lb 3.2 oz (59.1 kg)    Fetal Status: Fetal Heart Rate (bpm): 146   Movement: Present     General:  Alert, oriented and cooperative. Patient is in no acute distress.  Skin: Skin is warm and dry. No rash noted.   Cardiovascular: Normal heart rate noted  Respiratory: Normal respiratory effort, no problems with respiration noted  Abdomen: Soft, gravid, appropriate for gestational age.  Pain/Pressure: Present     Pelvic: Cervical exam deferred        Extremities: Normal range of motion.  Edema: Deep pitting, indentation remains for a short time  Mental Status: Normal mood and affect. Normal behavior. Normal judgment and thought content.   Assessment and Plan:  Pregnancy: G2P0101 at [redacted]w[redacted]d 1. Supervision of high risk pregnancy, antepartum Risk factors reviewed.  2. History of cesarean section Repeat C/S on 10/14 Desires IUD, breastfeeding.   Term labor symptoms and general obstetric precautions including but not limited to vaginal bleeding, contractions, leaking of fluid and fetal movement were reviewed in detail with  the patient. Please refer to After Visit Summary for other counseling recommendations.   Return in 1 week (on 06/16/2020).  Future Appointments  Date Time Provider Department Center  06/14/2020  8:20 AM [facility] 1 [facility] None  06/17/2020 10:35 AM Levie Heritage, DO [facility]  06/24/2020  9:55 AM Adam Phenix, MD [facility]    Malachy Chamber, MD

## 2020-06-10 ENCOUNTER — Inpatient Hospital Stay (HOSPITAL_COMMUNITY)
Admission: AD | Admit: 2020-06-10 | Discharge: 2020-06-13 | DRG: 788 | Disposition: A | Discharge status: Home / Self Care | Payer: Medicaid Other | Attending: Obstetrics & Gynecology | Admitting: Obstetrics & Gynecology

## 2020-06-10 ENCOUNTER — Other Ambulatory Visit: Payer: Self-pay

## 2020-06-10 ENCOUNTER — Encounter (HOSPITAL_COMMUNITY): Payer: Self-pay | Admitting: Obstetrics and Gynecology

## 2020-06-10 DIAGNOSIS — O4292 Full-term premature rupture of membranes, unspecified as to length of time between rupture and onset of labor: Principal | ICD-10-CM | POA: Diagnosis present

## 2020-06-10 DIAGNOSIS — Z8759 Personal history of other complications of pregnancy, childbirth and the puerperium: Secondary | ICD-10-CM

## 2020-06-10 DIAGNOSIS — O4290 Premature rupture of membranes, unspecified as to length of time between rupture and onset of labor, unspecified weeks of gestation: Secondary | ICD-10-CM | POA: Diagnosis present

## 2020-06-10 DIAGNOSIS — Z975 Presence of (intrauterine) contraceptive device: Secondary | ICD-10-CM

## 2020-06-10 DIAGNOSIS — Z98891 History of uterine scar from previous surgery: Secondary | ICD-10-CM

## 2020-06-10 DIAGNOSIS — O34211 Maternal care for low transverse scar from previous cesarean delivery: Secondary | ICD-10-CM | POA: Diagnosis present

## 2020-06-10 DIAGNOSIS — O0990 Supervision of high risk pregnancy, unspecified, unspecified trimester: Secondary | ICD-10-CM

## 2020-06-10 DIAGNOSIS — O321XX0 Maternal care for breech presentation, not applicable or unspecified: Secondary | ICD-10-CM | POA: Diagnosis present

## 2020-06-10 DIAGNOSIS — O34219 Maternal care for unspecified type scar from previous cesarean delivery: Secondary | ICD-10-CM | POA: Diagnosis present

## 2020-06-10 DIAGNOSIS — Z30430 Encounter for insertion of intrauterine contraceptive device: Secondary | ICD-10-CM

## 2020-06-10 DIAGNOSIS — Z20822 Contact with and (suspected) exposure to covid-19: Secondary | ICD-10-CM | POA: Diagnosis present

## 2020-06-10 DIAGNOSIS — Z3A38 38 weeks gestation of pregnancy: Secondary | ICD-10-CM

## 2020-06-10 DIAGNOSIS — O09899 Supervision of other high risk pregnancies, unspecified trimester: Secondary | ICD-10-CM

## 2020-06-10 LAB — AMNISURE RUPTURE OF MEMBRANE (ROM) NOT AT ARMC: Amnisure ROM: POSITIVE

## 2020-06-10 NOTE — MAU Note (Signed)
PT SAYS SHE IS REPEAT C/S ON 10-14. FEELS STRONG UC SINCE AM.  PNC WITH CLINIC . NO VE.

## 2020-06-11 ENCOUNTER — Inpatient Hospital Stay (HOSPITAL_COMMUNITY): Payer: Medicaid Other | Admitting: Anesthesiology

## 2020-06-11 ENCOUNTER — Encounter (HOSPITAL_COMMUNITY): Payer: Self-pay | Admitting: Obstetrics and Gynecology

## 2020-06-11 ENCOUNTER — Encounter (HOSPITAL_COMMUNITY)
Admission: AD | Disposition: A | Discharge status: Home / Self Care | Payer: Self-pay | Source: Home / Self Care | Attending: Obstetrics & Gynecology

## 2020-06-11 DIAGNOSIS — Z20822 Contact with and (suspected) exposure to covid-19: Secondary | ICD-10-CM | POA: Diagnosis present

## 2020-06-11 DIAGNOSIS — O4290 Premature rupture of membranes, unspecified as to length of time between rupture and onset of labor, unspecified weeks of gestation: Secondary | ICD-10-CM | POA: Diagnosis present

## 2020-06-11 DIAGNOSIS — O42013 Preterm premature rupture of membranes, onset of labor within 24 hours of rupture, third trimester: Secondary | ICD-10-CM | POA: Diagnosis not present

## 2020-06-11 DIAGNOSIS — Z30430 Encounter for insertion of intrauterine contraceptive device: Secondary | ICD-10-CM | POA: Diagnosis not present

## 2020-06-11 DIAGNOSIS — Z975 Presence of (intrauterine) contraceptive device: Secondary | ICD-10-CM

## 2020-06-11 DIAGNOSIS — O321XX0 Maternal care for breech presentation, not applicable or unspecified: Secondary | ICD-10-CM | POA: Diagnosis present

## 2020-06-11 DIAGNOSIS — O34219 Maternal care for unspecified type scar from previous cesarean delivery: Secondary | ICD-10-CM | POA: Diagnosis present

## 2020-06-11 DIAGNOSIS — O34211 Maternal care for low transverse scar from previous cesarean delivery: Secondary | ICD-10-CM | POA: Diagnosis present

## 2020-06-11 DIAGNOSIS — Z3A38 38 weeks gestation of pregnancy: Secondary | ICD-10-CM | POA: Diagnosis not present

## 2020-06-11 DIAGNOSIS — O4292 Full-term premature rupture of membranes, unspecified as to length of time between rupture and onset of labor: Secondary | ICD-10-CM | POA: Diagnosis present

## 2020-06-11 LAB — CBC
HCT: 24.2 % — ABNORMAL LOW (ref 36.0–46.0)
HCT: 26.9 % — ABNORMAL LOW (ref 36.0–46.0)
HCT: 38 % (ref 36.0–46.0)
Hemoglobin: 11.9 g/dL — ABNORMAL LOW (ref 12.0–15.0)
Hemoglobin: 7.6 g/dL — ABNORMAL LOW (ref 12.0–15.0)
Hemoglobin: 8.5 g/dL — ABNORMAL LOW (ref 12.0–15.0)
MCH: 25.9 pg — ABNORMAL LOW (ref 26.0–34.0)
MCH: 26.1 pg (ref 26.0–34.0)
MCH: 26.2 pg (ref 26.0–34.0)
MCHC: 31.3 g/dL (ref 30.0–36.0)
MCHC: 31.4 g/dL (ref 30.0–36.0)
MCHC: 31.6 g/dL (ref 30.0–36.0)
MCV: 82.8 fL (ref 80.0–100.0)
MCV: 82.8 fL (ref 80.0–100.0)
MCV: 83.2 fL (ref 80.0–100.0)
Platelets: 168 10*3/uL (ref 150–400)
Platelets: 197 10*3/uL (ref 150–400)
Platelets: 272 10*3/uL (ref 150–400)
RBC: 2.91 MIL/uL — ABNORMAL LOW (ref 3.87–5.11)
RBC: 3.25 MIL/uL — ABNORMAL LOW (ref 3.87–5.11)
RBC: 4.59 MIL/uL (ref 3.87–5.11)
RDW: 14.1 % (ref 11.5–15.5)
RDW: 14.1 % (ref 11.5–15.5)
RDW: 14.2 % (ref 11.5–15.5)
WBC: 10.9 10*3/uL — ABNORMAL HIGH (ref 4.0–10.5)
WBC: 9.4 10*3/uL (ref 4.0–10.5)
WBC: 9.9 10*3/uL (ref 4.0–10.5)
nRBC: 0 % (ref 0.0–0.2)
nRBC: 0 % (ref 0.0–0.2)
nRBC: 0 % (ref 0.0–0.2)

## 2020-06-11 LAB — RESPIRATORY PANEL BY RT PCR (FLU A&B, COVID)
Influenza A by PCR: NEGATIVE
Influenza B by PCR: NEGATIVE
SARS Coronavirus 2 by RT PCR: NEGATIVE

## 2020-06-11 LAB — TYPE AND SCREEN
ABO/RH(D): O POS
Antibody Screen: NEGATIVE

## 2020-06-11 LAB — RPR: RPR Ser Ql: NONREACTIVE

## 2020-06-11 SURGERY — Surgical Case
Anesthesia: Spinal

## 2020-06-11 MED ORDER — WITCH HAZEL-GLYCERIN EX PADS: 1.0000 "application " | MEDICATED_PAD | CUTANEOUS | Status: DC | PRN

## 2020-06-11 MED ORDER — OXYTOCIN-SODIUM CHLORIDE 30-0.9 UT/500ML-% IV SOLN
INTRAVENOUS | Status: DC | PRN
  Administered 2020-06-11: 30 [IU] via INTRAVENOUS

## 2020-06-11 MED ORDER — ONDANSETRON HCL 4 MG/2ML IJ SOLN
INTRAMUSCULAR | Status: DC | PRN
  Administered 2020-06-11: 4 mg via INTRAVENOUS

## 2020-06-11 MED ORDER — OXYTOCIN-SODIUM CHLORIDE 30-0.9 UT/500ML-% IV SOLN
INTRAVENOUS | Status: AC
  Filled 2020-06-11: qty 500

## 2020-06-11 MED ORDER — COCONUT OIL OIL
1.0000 "application " | TOPICAL_OIL | Status: DC | PRN
  Administered 2020-06-11: 1 via TOPICAL

## 2020-06-11 MED ORDER — PHENYLEPHRINE HCL (PRESSORS) 10 MG/ML IV SOLN
INTRAVENOUS | Status: DC | PRN
  Administered 2020-06-11 (×2): 80 ug via INTRAVENOUS

## 2020-06-11 MED ORDER — NALBUPHINE HCL 10 MG/ML IJ SOLN: 5.0000 mg | Freq: Once | INTRAMUSCULAR | Status: DC | PRN

## 2020-06-11 MED ORDER — NALBUPHINE HCL 10 MG/ML IJ SOLN: 5.0000 mg | INTRAMUSCULAR | Status: DC | PRN

## 2020-06-11 MED ORDER — DIPHENHYDRAMINE HCL 50 MG/ML IJ SOLN: 12.5000 mg | INTRAMUSCULAR | Status: DC | PRN

## 2020-06-11 MED ORDER — KETOROLAC TROMETHAMINE 30 MG/ML IJ SOLN
INTRAMUSCULAR | Status: AC
  Filled 2020-06-11: qty 1

## 2020-06-11 MED ORDER — MEPERIDINE HCL 25 MG/ML IJ SOLN: 6.2500 mg | INTRAMUSCULAR | Status: DC | PRN

## 2020-06-11 MED ORDER — TRANEXAMIC ACID-NACL 1000-0.7 MG/100ML-% IV SOLN
INTRAVENOUS | Status: AC
  Filled 2020-06-11: qty 100

## 2020-06-11 MED ORDER — DIPHENHYDRAMINE HCL 25 MG PO CAPS: 25.0000 mg | ORAL_CAPSULE | ORAL | Status: DC | PRN

## 2020-06-11 MED ORDER — KETOROLAC TROMETHAMINE 30 MG/ML IJ SOLN
INTRAMUSCULAR | Status: DC | PRN
  Administered 2020-06-11: 30 mg via INTRAVENOUS

## 2020-06-11 MED ORDER — SOD CITRATE-CITRIC ACID 500-334 MG/5ML PO SOLN
30.0000 mL | Freq: Once | ORAL | Status: AC
  Administered 2020-06-11: 30 mL via ORAL
  Filled 2020-06-11: qty 15

## 2020-06-11 MED ORDER — PHENYLEPHRINE 40 MCG/ML (10ML) SYRINGE FOR IV PUSH (FOR BLOOD PRESSURE SUPPORT)
80.0000 ug | PREFILLED_SYRINGE | Freq: Once | INTRAVENOUS | Status: AC | PRN
  Administered 2020-06-11: 80 ug via INTRAVENOUS
  Filled 2020-06-11: qty 10

## 2020-06-11 MED ORDER — SODIUM CHLORIDE 0.9 % IV SOLN: 500.0000 mg | Freq: Once | INTRAVENOUS | Status: DC

## 2020-06-11 MED ORDER — FAMOTIDINE IN NACL 20-0.9 MG/50ML-% IV SOLN
20.0000 mg | Freq: Once | INTRAVENOUS | Status: AC
  Administered 2020-06-11: 20 mg via INTRAVENOUS
  Filled 2020-06-11: qty 50

## 2020-06-11 MED ORDER — SCOPOLAMINE 1 MG/3DAYS TD PT72
1.0000 | MEDICATED_PATCH | Freq: Once | TRANSDERMAL | Status: DC
  Administered 2020-06-11: 1.5 mg via TRANSDERMAL

## 2020-06-11 MED ORDER — FENTANYL CITRATE (PF) 100 MCG/2ML IJ SOLN
INTRAMUSCULAR | Status: DC | PRN
  Administered 2020-06-11: 15 ug via INTRATHECAL

## 2020-06-11 MED ORDER — PROMETHAZINE HCL 25 MG/ML IJ SOLN: 6.2500 mg | INTRAMUSCULAR | Status: DC | PRN

## 2020-06-11 MED ORDER — ACETAMINOPHEN 500 MG PO TABS
1000.0000 mg | ORAL_TABLET | Freq: Four times a day (QID) | ORAL | Status: DC
  Filled 2020-06-11: qty 2

## 2020-06-11 MED ORDER — HYDROMORPHONE HCL 1 MG/ML IJ SOLN: 0.2000 mg | INTRAMUSCULAR | Status: DC | PRN

## 2020-06-11 MED ORDER — TRANEXAMIC ACID-NACL 1000-0.7 MG/100ML-% IV SOLN
1000.0000 mg | INTRAVENOUS | Status: AC
  Administered 2020-06-11: 1000 mg via INTRAVENOUS

## 2020-06-11 MED ORDER — NALOXONE HCL 4 MG/10ML IJ SOLN
1.0000 ug/kg/h | INTRAVENOUS | Status: DC | PRN
  Filled 2020-06-11: qty 5

## 2020-06-11 MED ORDER — KETOROLAC TROMETHAMINE 30 MG/ML IJ SOLN
30.0000 mg | Freq: Once | INTRAMUSCULAR | Status: AC
  Filled 2020-06-11: qty 1

## 2020-06-11 MED ORDER — ONDANSETRON HCL 4 MG/2ML IJ SOLN
INTRAMUSCULAR | Status: AC
  Filled 2020-06-11: qty 2

## 2020-06-11 MED ORDER — SCOPOLAMINE 1 MG/3DAYS TD PT72
MEDICATED_PATCH | TRANSDERMAL | Status: AC
  Filled 2020-06-11: qty 1

## 2020-06-11 MED ORDER — MORPHINE SULFATE (PF) 0.5 MG/ML IJ SOLN
INTRAMUSCULAR | Status: AC
  Filled 2020-06-11: qty 10

## 2020-06-11 MED ORDER — SODIUM CHLORIDE 0.9 % IV SOLN
510.0000 mg | Freq: Once | INTRAVENOUS | Status: AC
  Administered 2020-06-11: 510 mg via INTRAVENOUS
  Filled 2020-06-11: qty 17

## 2020-06-11 MED ORDER — NALOXONE HCL 0.4 MG/ML IJ SOLN: 0.4000 mg | INTRAMUSCULAR | Status: DC | PRN

## 2020-06-11 MED ORDER — KETOROLAC TROMETHAMINE 30 MG/ML IJ SOLN: 30.0000 mg | Freq: Four times a day (QID) | INTRAMUSCULAR | Status: AC | PRN

## 2020-06-11 MED ORDER — FENTANYL CITRATE (PF) 100 MCG/2ML IJ SOLN
INTRAMUSCULAR | Status: AC
  Filled 2020-06-11: qty 2

## 2020-06-11 MED ORDER — LACTATED RINGERS IV SOLN: INTRAVENOUS | Status: DC

## 2020-06-11 MED ORDER — PRENATAL MULTIVITAMIN CH
1.0000 | ORAL_TABLET | Freq: Every day | ORAL | Status: DC
  Administered 2020-06-11 – 2020-06-12 (×2): 1 via ORAL
  Filled 2020-06-11 (×2): qty 1

## 2020-06-11 MED ORDER — BUPIVACAINE IN DEXTROSE 0.75-8.25 % IT SOLN
INTRATHECAL | Status: DC | PRN
  Administered 2020-06-11: 1.4 mL via INTRATHECAL

## 2020-06-11 MED ORDER — DIPHENHYDRAMINE HCL 25 MG PO CAPS: 25.0000 mg | ORAL_CAPSULE | Freq: Four times a day (QID) | ORAL | Status: DC | PRN

## 2020-06-11 MED ORDER — ONDANSETRON HCL 4 MG/2ML IJ SOLN: 4.0000 mg | Freq: Three times a day (TID) | INTRAMUSCULAR | Status: DC | PRN

## 2020-06-11 MED ORDER — OXYCODONE HCL 5 MG PO TABS
5.0000 mg | ORAL_TABLET | ORAL | Status: DC | PRN
  Administered 2020-06-12: 5 mg via ORAL
  Filled 2020-06-11: qty 1

## 2020-06-11 MED ORDER — IBUPROFEN 800 MG PO TABS
800.0000 mg | ORAL_TABLET | Freq: Four times a day (QID) | ORAL | Status: DC
  Administered 2020-06-12 – 2020-06-13 (×5): 800 mg via ORAL
  Filled 2020-06-11 (×5): qty 1

## 2020-06-11 MED ORDER — HYDROMORPHONE HCL 1 MG/ML IJ SOLN: 0.2500 mg | INTRAMUSCULAR | Status: DC | PRN

## 2020-06-11 MED ORDER — PHENYLEPHRINE HCL-NACL 20-0.9 MG/250ML-% IV SOLN
INTRAVENOUS | Status: AC
  Filled 2020-06-11: qty 250

## 2020-06-11 MED ORDER — ZOLPIDEM TARTRATE 5 MG PO TABS: 5.0000 mg | ORAL_TABLET | Freq: Every evening | ORAL | Status: DC | PRN

## 2020-06-11 MED ORDER — ALBUMIN HUMAN 5 % IV SOLN
INTRAVENOUS | Status: AC
  Filled 2020-06-11: qty 250

## 2020-06-11 MED ORDER — KETOROLAC TROMETHAMINE 30 MG/ML IJ SOLN: 30.0000 mg | Freq: Once | INTRAMUSCULAR | Status: DC | PRN

## 2020-06-11 MED ORDER — ENOXAPARIN SODIUM 40 MG/0.4ML ~~LOC~~ SOLN
40.0000 mg | SUBCUTANEOUS | Status: DC
  Administered 2020-06-11: 40 mg via SUBCUTANEOUS
  Filled 2020-06-11 (×2): qty 0.4

## 2020-06-11 MED ORDER — STERILE WATER FOR IRRIGATION IR SOLN
Status: DC | PRN
  Administered 2020-06-11: 1

## 2020-06-11 MED ORDER — LEVONORGESTREL 19.5 MCG/DAY IU IUD
INTRAUTERINE_SYSTEM | Freq: Once | INTRAUTERINE | Status: AC
  Administered 2020-06-11: 1 via INTRAUTERINE
  Filled 2020-06-11: qty 1

## 2020-06-11 MED ORDER — PHENYLEPHRINE 40 MCG/ML (10ML) SYRINGE FOR IV PUSH (FOR BLOOD PRESSURE SUPPORT)
PREFILLED_SYRINGE | INTRAVENOUS | Status: AC
  Filled 2020-06-11: qty 10

## 2020-06-11 MED ORDER — LEVONORGESTREL 19.5 MCG/DAY IU IUD
INTRAUTERINE_SYSTEM | INTRAUTERINE | Status: AC
  Filled 2020-06-11: qty 1

## 2020-06-11 MED ORDER — MORPHINE SULFATE (PF) 0.5 MG/ML IJ SOLN
INTRAMUSCULAR | Status: DC | PRN
  Administered 2020-06-11: .15 mg via INTRATHECAL

## 2020-06-11 MED ORDER — ACETAMINOPHEN 500 MG PO TABS
1000.0000 mg | ORAL_TABLET | Freq: Four times a day (QID) | ORAL | Status: DC
  Administered 2020-06-11 – 2020-06-13 (×8): 1000 mg via ORAL
  Filled 2020-06-11 (×8): qty 2

## 2020-06-11 MED ORDER — DIBUCAINE (PERIANAL) 1 % EX OINT: 1.0000 "application " | TOPICAL_OINTMENT | CUTANEOUS | Status: DC | PRN

## 2020-06-11 MED ORDER — SIMETHICONE 80 MG PO CHEW
80.0000 mg | CHEWABLE_TABLET | ORAL | Status: DC
  Administered 2020-06-13: 80 mg via ORAL
  Filled 2020-06-11 (×2): qty 1

## 2020-06-11 MED ORDER — TETANUS-DIPHTH-ACELL PERTUSSIS 5-2.5-18.5 LF-MCG/0.5 IM SUSP: 0.5000 mL | Freq: Once | INTRAMUSCULAR | Status: DC

## 2020-06-11 MED ORDER — LACTATED RINGERS IV SOLN: INTRAVENOUS | Status: DC | PRN

## 2020-06-11 MED ORDER — SIMETHICONE 80 MG PO CHEW
80.0000 mg | CHEWABLE_TABLET | Freq: Three times a day (TID) | ORAL | Status: DC
  Administered 2020-06-11 – 2020-06-12 (×6): 80 mg via ORAL
  Filled 2020-06-11 (×6): qty 1

## 2020-06-11 MED ORDER — OXYTOCIN-SODIUM CHLORIDE 30-0.9 UT/500ML-% IV SOLN: 2.5000 [IU]/h | INTRAVENOUS | Status: AC

## 2020-06-11 MED ORDER — MENTHOL 3 MG MT LOZG: 1.0000 | LOZENGE | OROMUCOSAL | Status: DC | PRN

## 2020-06-11 MED ORDER — ALBUMIN HUMAN 5 % IV SOLN
12.5000 g | Freq: Once | INTRAVENOUS | Status: AC
  Administered 2020-06-11: 12.5 g via INTRAVENOUS

## 2020-06-11 MED ORDER — PHENYLEPHRINE HCL-NACL 20-0.9 MG/250ML-% IV SOLN
INTRAVENOUS | Status: DC | PRN
  Administered 2020-06-11: 60 ug/min via INTRAVENOUS

## 2020-06-11 MED ORDER — SODIUM CHLORIDE 0.9% FLUSH: 3.0000 mL | INTRAVENOUS | Status: DC | PRN

## 2020-06-11 MED ORDER — SENNOSIDES-DOCUSATE SODIUM 8.6-50 MG PO TABS
2.0000 | ORAL_TABLET | ORAL | Status: DC
  Administered 2020-06-12 – 2020-06-13 (×2): 2 via ORAL
  Filled 2020-06-11 (×2): qty 2

## 2020-06-11 MED ORDER — SOD CITRATE-CITRIC ACID 500-334 MG/5ML PO SOLN: 30.0000 mL | ORAL | Status: DC

## 2020-06-11 MED ORDER — SIMETHICONE 80 MG PO CHEW
80.0000 mg | CHEWABLE_TABLET | ORAL | Status: DC | PRN
  Administered 2020-06-12: 80 mg via ORAL

## 2020-06-11 MED ORDER — KETOROLAC TROMETHAMINE 30 MG/ML IJ SOLN
30.0000 mg | Freq: Four times a day (QID) | INTRAMUSCULAR | Status: AC
  Administered 2020-06-11 – 2020-06-12 (×3): 30 mg via INTRAVENOUS
  Filled 2020-06-11 (×3): qty 1

## 2020-06-11 MED ORDER — CEFAZOLIN SODIUM-DEXTROSE 2-4 GM/100ML-% IV SOLN
2.0000 g | INTRAVENOUS | Status: AC
  Administered 2020-06-11: 2 g via INTRAVENOUS
  Filled 2020-06-11: qty 100

## 2020-06-11 SURGICAL SUPPLY — 36 items
BARRIER ADHS 3X4 INTERCEED (GAUZE/BANDAGES/DRESSINGS) IMPLANT
BENZOIN TINCTURE PRP APPL 2/3 (GAUZE/BANDAGES/DRESSINGS) ×3 IMPLANT
CHLORAPREP W/TINT 26ML (MISCELLANEOUS) ×3 IMPLANT
CLAMP CORD UMBIL (MISCELLANEOUS) IMPLANT
CLOSURE STERI STRIP 1/2 X4 (GAUZE/BANDAGES/DRESSINGS) ×3 IMPLANT
CLOTH BEACON ORANGE TIMEOUT ST (SAFETY) ×3 IMPLANT
DRSG OPSITE POSTOP 4X10 (GAUZE/BANDAGES/DRESSINGS) ×3 IMPLANT
ELECT REM PT RETURN 9FT ADLT (ELECTROSURGICAL) ×3
ELECTRODE REM PT RTRN 9FT ADLT (ELECTROSURGICAL) ×1 IMPLANT
EXTRACTOR VACUUM KIWI (MISCELLANEOUS) IMPLANT
GLOVE BIO SURGEON STRL SZ 6.5 (GLOVE) ×2 IMPLANT
GLOVE BIO SURGEONS STRL SZ 6.5 (GLOVE) ×1
GLOVE BIOGEL PI IND STRL 7.0 (GLOVE) ×2 IMPLANT
GLOVE BIOGEL PI INDICATOR 7.0 (GLOVE) ×4
GOWN STRL REUS W/TWL LRG LVL3 (GOWN DISPOSABLE) ×6 IMPLANT
KIT ABG SYR 3ML LUER SLIP (SYRINGE) IMPLANT
NEEDLE HYPO 22GX1.5 SAFETY (NEEDLE) IMPLANT
NEEDLE HYPO 25X5/8 SAFETYGLIDE (NEEDLE) IMPLANT
NS IRRIG 1000ML POUR BTL (IV SOLUTION) ×3 IMPLANT
PACK C SECTION WH (CUSTOM PROCEDURE TRAY) ×3 IMPLANT
PAD ABD 7.5X8 STRL (GAUZE/BANDAGES/DRESSINGS) ×3 IMPLANT
PAD OB MATERNITY 4.3X12.25 (PERSONAL CARE ITEMS) ×3 IMPLANT
PENCIL SMOKE EVAC W/HOLSTER (ELECTROSURGICAL) ×3 IMPLANT
RETRACTOR WND ALEXIS 25 LRG (MISCELLANEOUS) IMPLANT
RTRCTR WOUND ALEXIS 25CM LRG (MISCELLANEOUS)
SPONGE GAUZE 4X4 12PLY STER LF (GAUZE/BANDAGES/DRESSINGS) ×6 IMPLANT
SPONGE LAP 18X18 RF (DISPOSABLE) ×3 IMPLANT
SUT VIC AB 0 CT1 36 (SUTURE) ×18 IMPLANT
SUT VIC AB 2-0 CT1 27 (SUTURE) ×2
SUT VIC AB 2-0 CT1 TAPERPNT 27 (SUTURE) ×1 IMPLANT
SUT VIC AB 4-0 PS2 27 (SUTURE) ×3 IMPLANT
SYR CONTROL 10ML LL (SYRINGE) IMPLANT
TAPE MEDIFIX FOAM 3 (GAUZE/BANDAGES/DRESSINGS) ×3 IMPLANT
TOWEL OR 17X24 6PK STRL BLUE (TOWEL DISPOSABLE) ×3 IMPLANT
TRAY FOLEY W/BAG SLVR 14FR LF (SET/KITS/TRAYS/PACK) IMPLANT
WATER STERILE IRR 1000ML POUR (IV SOLUTION) ×3 IMPLANT

## 2020-06-11 NOTE — Op Note (Signed)
Cesarean Section Procedure Note  06/11/2020  2:59 AM  PATIENT:  Brandi Lee  24 y.o. female  PRE-OPERATIVE DIAGNOSIS:  repeat c-section with PP IUD   POST-OPERATIVE DIAGNOSIS:  repeat c-section with PP IUD   PROCEDURE:  Procedure(s): CESAREAN SECTION and IUD insertion (N/A)  SURGEON:  Surgeon(s) and Role:    * Alric Seton, MD - Assisting    * Hermina Staggers, MD  ANESTHESIA:   spinal  EBL:  Total I/O In: 2100 [I.V.:2100] Out: 1139 [Urine:200; Blood:939]  BLOOD ADMINISTERED:none  DRAINS: none   LOCAL MEDICATIONS USED:  NONE  SPECIMEN:  No Specimen  DISPOSITION OF SPECIMEN:  N/A   Procedure Details   The patient was seen in the Holding Room. The risks, benefits, complications, treatment options, and expected outcomes were discussed with the patient.  The patient concurred with the proposed plan, giving informed consent.  The site of surgery properly noted/marked. The patient was taken to Operating Room C, identified as Brandi Lee and the procedure verified as C-Section Delivery. A Time Out was held and the above information confirmed.  After induction of anesthesia, the patient was draped and prepped in the usual sterile manner. A Pfannenstiel incision was made and carried down through the subcutaneous tissue to the fascia. Fascial incision was made and extended transversely. The fascia was separated from the underlying rectus tissue superiorly and inferiorly. The peritoneum was identified and entered. Peritoneal incision was extended longitudinally. The utero-vesical peritoneal reflection was incised transversely and the bladder flap was freed from the lower uterine segment. A low transverse uterine incision was made. Delivered from breech presentation was a 2860 gram Female with Apgar scores of 8 at one minute and 8 at five minutes. A laceration to the left lateral thigh area was caused with the scalpel upon entry to the thin uterine segment. After the umbilical cord  was clamped and cut cord blood was obtained for evaluation. The placenta was removed intact and appeared normal. The uterine outline, tubes and ovaries appeared normal. The uterine incision was closed with running locked sutures of Chromic. Hemostasis was observed. Lavage was carried out until clear. The fascia was then reapproximated with running sutures of 1.0 Vicryl. The subcutaneous layer was reapproximated with 3.0 vicryl.The skin was reapproximated with 4.0 Vicryl.  Instrument, sponge, and needle counts were correct prior the abdominal closure and at the conclusion of the case.   Complications:  None; patient tolerated the procedure well.  COUNTS:  YES  PLAN OF CARE: Admit to inpatient   PATIENT DISPOSITION:  PACU - hemodynamically stable.   Delay start of Pharmacological VTE agent (>24hrs) due to surgical blood loss or risk of bleeding: no             Disposition: PACU - hemodynamically stable.         Condition: stable   Alric Seton, MD 06/11/2020 2:59 AM

## 2020-06-11 NOTE — H&P (Signed)
OBSTETRIC ADMISSION HISTORY AND PHYSICAL  Brandi Lee is a 24 y.o. female G79P0101 with IUP at 90w3dby LMP presenting for PROM (10/8 @1900 ) and contractions. She reports +FMs, no VB, no blurry vision, headaches or peripheral edema, and RUQ pain.  She plans on breast feeding. She request PP Liletta for birth control. [facility]  Dating: By LMP --->  Estimated Date of Delivery: 06/22/20  Sono:    05/18/20@[redacted]w[redacted]d , CWD, normal anatomy, breech presentation, 2435g, 31% EFW   Prenatal History/Complications:  Hx IUGR (G1 907g) Prior cesarean section (PTL, NRFHT)  Past Medical History: Past Medical History:  Diagnosis Date   Medical history non-contributory     Past Surgical History: Past Surgical History:  Procedure Laterality Date   CESAREAN SECTION      Obstetrical History: OB History    Gravida  2   Para  1   Term      Preterm  1   AB      Living  1     SAB      TAB      Ectopic      Multiple      Live Births  1           Social History Social History   Socioeconomic History   Marital status: Single    Spouse name: Not on file   Number of children: Not on file   Years of education: Not on file   Highest education level: Not on file  Occupational History   Not on file  Tobacco Use   Smoking status: Never Smoker   Smokeless tobacco: Never Used  Vaping Use   Vaping Use: Never used  Substance and Sexual Activity   Alcohol use: No   Drug use: No   Sexual activity: Yes    Birth control/protection: None  Other Topics Concern   Not on file  Social History Narrative   Not on file   Social Determinants of Health   Financial Resource Strain:    Difficulty of Paying Living Expenses: Not on file  Food Insecurity: No Food Insecurity   Worried About Running Out of Food in the Last Year: Never true   Ran Out of Food in the Last Year: Never true  Transportation Needs: No Transportation Needs   Lack of  Transportation (Medical): No   Lack of Transportation (Non-Medical): No  Physical Activity:    Days of Exercise per Week: Not on file   Minutes of Exercise per Session: Not on file  Stress:    Feeling of Stress : Not on file  Social Connections:    Frequency of Communication with Friends and Family: Not on file   Frequency of Social Gatherings with Friends and Family: Not on file   Attends Religious Services: Not on file   Active Member of Clubs or Organizations: Not on file   Attends CArchivistMeetings: Not on file   Marital Status: Not on file    Family History: History reviewed. No pertinent family history.  Allergies: No Known Allergies  Medications Prior to Admission  Medication Sig Dispense Refill Last Dose   Prenatal Vit-Fe Fumarate-FA (MULTIVITAMIN-PRENATAL) 27-0.8 MG TABS tablet Take 1 tablet by mouth daily at 12 noon. (Patient taking differently: Take 1 tablet by mouth daily. ) 30 tablet 9 06/10/2020 at Unknown time   Blood Pressure Monitoring (BLOOD PRESSURE KIT) DEVI 1 Device by Does not apply route as needed. 1 each  0      Review of Systems   All systems reviewed and negative except as stated in HPI  Blood pressure (!) 116/57, pulse 78, temperature 99.5 F (37.5 C), temperature source Oral, resp. rate 18, height 4' 11"  (1.499 m), weight 58.1 kg, last menstrual period 09/16/2019. General appearance: alert, cooperative and no distress Lungs: normal respiratory effort Heart: regular rate and rhythm Abdomen: soft, non-tender; gravid Pelvic: as noted below Extremities: Homans sign is negative, no sign of DVT Presentation: unsure Fetal monitoringBaseline: 150 bpm, Variability: Good {> 6 bpm), Accelerations: Reactive and Decelerations: Absent Uterine activityFrequency: Every 5 minutes Dilation: 1.5 Effacement (%): 80 Station: -2 Exam by:: K.Wilson,RN   Prenatal labs: ABO, Rh: O/Positive/-- (06/01 1547) Antibody: Negative (06/01  1547) Rubella: 12.80 (06/01 1547) RPR: Non Reactive (07/28 0821)  HBsAg: Negative (06/01 1547)  HIV: Non Reactive (07/28 0821)  GBS: Negative/-- (09/22 1424)  2 hr Glucola passed Genetic screening  Not done Anatomy US normal  Prenatal Transfer Tool  Maternal Diabetes: No Genetic Screening: Declined Maternal Ultrasounds/Referrals: Isolated EIF (echogenic intracardiac focus) Fetal Ultrasounds or other Referrals:  None Maternal Substance Abuse:  No Significant Maternal Medications:  None Significant Maternal Lab Results: None  Results for orders placed or performed during the hospital encounter of 06/10/20 (from the past 24 hour(s))  Amnisure rupture of membrane (rom)not at [facility]   Collection Time: 06/10/20 11:21 PM  Result Value Ref Range   Amnisure ROM POSITIVE     Patient Active Problem List   Diagnosis Date Noted   History of prior pregnancy with IUGR newborn 04/27/2020   History of cesarean section 02/02/2020   Supervision of high risk pregnancy, antepartum 01/25/2020   History of preterm delivery, currently pregnant 01/25/2020    Assessment/Plan:  Brandi Lee is a 24 y.o. G2P0101 at 55w3dhere for PROM (10/8 @1900 ), desires rLTCS.  #PROM #Desires rLTCS The risks of cesarean section were discussed with the patient including but were not limited to: bleeding which may require transfusion or reoperation; infection which may require antibiotics; injury to bowel, bladder, ureters or other surrounding organs; injury to the fetus; need for additional procedures including hysterectomy in the event of a life-threatening hemorrhage; placental abnormalities wth subsequent pregnancies, incisional problems, thromboembolic phenomenon and other postoperative/anesthesia complications. The patient concurred with the proposed plan, giving informed written consent for the procedures.  Patient has been NPO since 1900 on 10/8 she will remain NPO for procedure. Anesthesia and OR aware.   Preoperative prophylactic antibiotics and SCDs ordered on call to the OR.  To OR when ready.  Additionally desires post placental Liletta, discussed risks benefits with patient and consent signed.   #Pain: spinal #FWB: Cat 1 #ID: GBS neg, ancef intraoperatively  #MOF: breast #MOC:PP Liletta, will place intraoperatively  #Circ: n/a  AArrie Senate MD  06/11/2020, 12:11 AM

## 2020-06-11 NOTE — Discharge Summary (Signed)
Postpartum Discharge Summary     Patient Name: Brandi Lee DOB: [birth year] MRN: [id number]  Date of admission: 06/10/2020 Delivery date:06/11/2020  Delivering provider: Chancy Milroy  Date of discharge: 06/13/2020  Admitting diagnosis: PROM (premature rupture of membranes) [O42.90] Previous cesarean delivery affecting pregnancy, antepartum [O34.219] Intrauterine pregnancy: [redacted]w[redacted]d    Secondary diagnosis:  Active Problems:   Supervision of high risk pregnancy, antepartum   History of preterm delivery, currently pregnant   History of cesarean section   History of prior pregnancy with IUGR newborn   PROM (premature rupture of membranes)   Cesarean delivery delivered   IUD (intrauterine device) in place   Previous cesarean delivery affecting pregnancy, antepartum  Additional problems: none    Discharge diagnosis: Term Pregnancy Delivered                                              Post partum procedures:post placental liletta placed Augmentation: N/A Complications: None  Hospital course: Onset of Labor With Unplanned C/S   24y.o. yo GD3U2025at 326w3das admitted in Latent Labor on 06/10/2020. Patient presented to the MAU with PROM (10/8 @1900 ). Patient endorsed contractions every 5 minutes upon presentation. Given patient had prior cesarean section and desired repeat cesarean section she was brought to the OR at that time. The patient went for cesarean section due to Elective Repeat. Delivery details as follows: Membrane Rupture Time/Date: 7:00 PM ,06/10/2020   Delivery Method:C-Section, Low Transverse  Details of operation can be found in separate operative note. Patient had an uncomplicated postpartum course.  She is ambulating,tolerating a regular diet, passing flatus, and urinating well.  Patient is discharged home in stable condition 06/13/20.  Newborn Data: Birth date:06/11/2020  Birth time:1:37 AM  Gender:Female  Living status:Living  Apgars:8 ,8  Weight:2860 g    Magnesium Sulfate received: No BMZ received: No Rhophylac:N/A MMR:N/A T-DaP:Given prenatally Flu: No Transfusion:No  Physical exam  Vitals:   06/12/20 1558 06/12/20 1917 06/12/20 2311 06/13/20 0442  BP: (!) 108/48 (!) 102/52 (!) 106/50 (!) 110/57  Pulse: 73 77 64 68  Resp: 18 18 18 18   Temp: 98.3 F (36.8 C) 98.6 F (37 C) 98.7 F (37.1 C) 98.7 F (37.1 C)  TempSrc: Oral Oral Oral Oral  SpO2: 100% 100% 100% 99%  Weight:      Height:       General: alert, cooperative and no distress Lochia: appropriate Uterine Fundus: firm Incision: Healing well with no significant drainage, Dressing is clean, dry, and intact DVT Evaluation: No evidence of DVT seen on physical exam. Negative Homan's sign. No cords or calf tenderness. Labs: Lab Results  Component Value Date   WBC 9.4 06/11/2020   HGB 7.6 (L) 06/11/2020   HCT 24.2 (L) 06/11/2020   MCV 83.2 06/11/2020   PLT 168 06/11/2020   No flowsheet data found. Edinburgh Score: No flowsheet data found.   After visit meds:  Allergies as of 06/13/2020   No Known Allergies     Medication List    STOP taking these medications   multivitamin-prenatal 27-0.8 MG Tabs tablet     TAKE these medications   Blood Pressure Kit Devi 1 Device by Does not apply route as needed.   ferrous sulfate 325 (65 FE) MG tablet Commonly known as: FerrouSul Take 1 tablet (325 mg total) by mouth 2 (two)  times daily.   ibuprofen 800 MG tablet Commonly known as: ADVIL Take 1 tablet (800 mg total) by mouth 3 (three) times daily with meals as needed for fever, mild pain, moderate pain or cramping.   oxyCODONE 5 MG immediate release tablet Commonly known as: Oxy IR/ROXICODONE Take 1 tablet (5 mg total) by mouth every 4 (four) hours as needed for severe pain or breakthrough pain.   senna-docusate 8.6-50 MG tablet Commonly known as: Senokot-S Take 2 tablets by mouth at bedtime as needed for mild constipation or moderate constipation.             Discharge Care Instructions  (From admission, onward)         Start     Ordered   06/13/20 0000  Discharge wound care:       Comments: As per discharge handout and nursing instructions   06/13/20 0649           Discharge home in stable condition Infant Feeding: Breast Infant Disposition:home with mother Discharge instruction: per After Visit Summary and Postpartum booklet. Activity: Advance as tolerated. Pelvic rest for 6 weeks.  Diet: routine diet  Follow up Visit: 2 weeks incision checl   Please schedule this patient for a In person postpartum visit in 4 weeks with the following provider: Any provider. Please perform IUD string check at postpartum visit. Additional Postpartum F/U:Incision check 2 week  Low risk pregnancy complicated by: n/a Delivery mode:  C-Section, Low Transverse  Anticipated Birth Control:  PP IUD placed   06/13/2020 Verita Schneiders, MD

## 2020-06-11 NOTE — Lactation Note (Signed)
This note was copied from a baby's chart. Lactation Consultation Note  Patient Name: Boy Fumiye Lubben SYPZX'[id number] Date: 06/11/2020 Reason for consult: Initial assessment;NICU baby;Early term 37-38.6wks (mom with anemia)  Met with mom and dad of baby Hideko Esselman now 26 hours old at moms bedside..  Mom reports she had planned to breastfeed. Mom has not initiated pumping yet. Moms first baby was in NICu for prematurity.  Mom reports she pumped and breastfed that baby for 3 months.  Mom was on [facility] with her first baby.  Dad reports that [place] moved [place] not to long ago.  Dad reports mom has Medicaid but she has not gotten on [facility] with this baby.  Dad answers many questions.  Initiated pumping with DEBP with mom. Mom able to demo back how to put pump parts together and take them apart for cleaning. Urged mom to pump 8-12 times day.  Mom reports remembering her first child ate every 2 hours during the day.  Discussed with mom pumping every 2 hours during the day and every 3-4 hours at night if that was easier for her to remember. Used 24 mm flanges with mom while pumping.  Discussed how she may need to go up to 27 mm flanges with continued pumpings.   Sent WIC referral.  Reviewed and left [facility] Consultation Breastfeeding handout.  Urged to call lactation as needed.  Consult Status: Follow-up Date: 06/12/20 Follow-up type: In-patient    [facility] Caul 06/11/2020, 12:41 PM

## 2020-06-11 NOTE — Consult Note (Signed)
Delivery Note:  C-section       06/11/2020  2:33 AM  I was called to the operating room at the request of the patient's obstetrician (Dr. Alysia Penna) for a repeat c-section.  PRENATAL HX:  This is a 24 y/o G2P0101 at 15 and 3/[redacted] weeks gestation who was admitted for repeat C-section following SROM.   She is GBS negative with ROM x 7 hours.  Pregnancy complicated by isolated echogenic cardiac focus on prenatal ultrasound and previous preterm delivery at [redacted] weeks gestation.    DELIVERY:  Infant was not vigorous at delivery so cord clamping was not delayed.  He was brought to the warmer and responded to standard warming, drying and stimulation.  APGARs 8 and 8.  Oxygen saturation was in mid 70s by 5 minutes of age so blow by O2 initiated.  Infant began retracting so CPAP initiated at around 7 minutes of age.  Unable to remove CPAP without desaturation to mid 80s.  Infant admitted to NICU on CPAP +5, 30%.  Exam also notable for deep 6 cm laceration to left lateral thigh.       _____________________ Electronically Signed By: Maryan Char, MD Neonatologist

## 2020-06-11 NOTE — Transfer of Care (Signed)
Immediate Anesthesia Transfer of Care Note  Patient: Brandi Lee  Procedure(s) Performed: CESAREAN SECTION and IUD insertion (N/A )  Patient Location: PACU  Anesthesia Type:Spinal  Level of Consciousness: alert  and oriented  Airway & Oxygen Therapy: Patient Spontanous Breathing  Post-op Assessment: Report given to RN and Post -op Vital signs reviewed and stable  Post vital signs: Reviewed and stable  Last Vitals:  Vitals Value Taken Time  BP 154/132 06/11/20 0233  Temp    Pulse 97 06/11/20 0236  Resp 21 06/11/20 0236  SpO2 100 % 06/11/20 0236  Vitals shown include unvalidated device data.  Last Pain:  Vitals:   06/10/20 2226  TempSrc: Oral  PainSc:          Complications: No complications documented.

## 2020-06-11 NOTE — Discharge Instructions (Signed)

## 2020-06-11 NOTE — Procedures (Signed)
Post-Placental IUD Insertion Procedure Note  Patient identified, informed consent signed prior to delivery, signed copy in chart, time out was performed.  Procedure was performed intraoperatively.   - IUD grasped with hemostat and inserted into uterine fundus through low transverse uterine incision. IUD carefully released at the fundus and hemostats gently removed. Strings fed through external cervical os with kelly clamp. After cesarean section called back to bedside as strings were hanging out of vaginal os, trimmed to the level of vaginal introitus at this time.  Patient given post procedure instructions and IUD care card with expiration date.  Patient is asked to keep IUD strings tucked in her vagina until her postpartum follow up visit in 4-6 weeks. Patient advised to abstain from sexual intercourse and pulling on strings before her follow-up visit. Patient verbalized an understanding of the plan of care and agrees.   Alric Seton, MD OB Fellow, Faculty [facility], [facility] 06/11/2020 5:30 AM

## 2020-06-11 NOTE — Anesthesia Preprocedure Evaluation (Signed)
Anesthesia Evaluation  Patient identified by MRN, date of birth, ID band Patient awake    Reviewed: Allergy & Precautions, H&P , NPO status , Patient's Chart, lab work & pertinent test results  Airway Mallampati: I   Neck ROM: full    Dental no notable dental hx.    Pulmonary neg pulmonary ROS,    Pulmonary exam normal        Cardiovascular negative cardio ROS Normal cardiovascular exam     Neuro/Psych negative neurological ROS  negative psych ROS   GI/Hepatic negative GI ROS, Neg liver ROS,   Endo/Other  negative endocrine ROS  Renal/GU negative Renal ROS  negative genitourinary   Musculoskeletal negative musculoskeletal ROS (+)   Abdominal Normal abdominal exam  (+)   Peds  Hematology negative hematology ROS (+)   Anesthesia Other Findings   Reproductive/Obstetrics (+) Pregnancy                             Anesthesia Physical Anesthesia Plan  ASA: II  Anesthesia Plan: Spinal   Post-op Pain Management:    Induction:   PONV Risk Score and Plan: 3 and Ondansetron, Dexamethasone and Scopolamine patch - Pre-op  Airway Management Planned: Nasal Cannula and Natural Airway  Additional Equipment: None  Intra-op Plan:   Post-operative Plan:   Informed Consent: I have reviewed the patients History and Physical, chart, labs and discussed the procedure including the risks, benefits and alternatives for the proposed anesthesia with the patient or authorized representative who has indicated his/her understanding and acceptance.       Plan Discussed with: CRNA  Anesthesia Plan Comments:         Anesthesia Quick Evaluation

## 2020-06-11 NOTE — Anesthesia Procedure Notes (Signed)
Spinal  Patient location during procedure: OR Start time: 06/11/2020 1:13 AM End time: 06/11/2020 1:15 AM Staffing Performed: anesthesiologist  Anesthesiologist: Leilani Able, MD Preanesthetic Checklist Completed: patient identified, IV checked, site marked, risks and benefits discussed, surgical consent, monitors and equipment checked, pre-op evaluation and timeout performed Spinal Block Patient position: sitting Prep: DuraPrep and site prepped and draped Patient monitoring: continuous pulse ox and blood pressure Approach: midline Location: L3-4 Injection technique: single-shot Needle Needle type: Pencan  Needle gauge: 24 G Needle length: 10 cm Needle insertion depth: 4 cm Assessment Sensory level: T4

## 2020-06-12 NOTE — Lactation Note (Signed)
This note was copied from a baby's chart. Lactation Consultation Note  Patient Name: Brandi Lee [id number] Date: 06/12/2020 Reason for consult: Follow-up assessment  LC to room for f/u visit. This mom has normal breast development and did not bf her 1st baby. Mom is using DEBP but concerned r/t not seeing colostrum yet. LC reviewed norms on day 2 and encouraged her to continue pumping q3 hours and HE for 2-3 minutes p pumping prn to collect colostrum. Reinforced earlier teaching r/t pump cleaning and milk storage. Offered opportunity for patient and FOB to ask questions. Will plan f/u visit.   Maternal Data Has patient been taught Hand Expression?: Yes Does the patient have breastfeeding experience prior to this delivery?: No  Feeding Feeding Type: Formula   Interventions Interventions: Breast feeding basics reviewed  Lactation Tools Discussed/Used WIC Program: No Pt will consider WIC participation.   Consult Status Consult Status: Follow-up Date: 06/13/20 Follow-up type: In-patient    Elder Negus 06/12/2020, 2:26 PM

## 2020-06-12 NOTE — Progress Notes (Signed)
Subjective: Postpartum Day 1: Cesarean Delivery Patient reports feeling well and is without complaints. She denies chest pain, shortness of breath, lightheadedness/dizziness.    Objective: Vital signs in last 24 hours: Temp:  [98 F (36.7 C)-98.9 F (37.2 C)] 98.7 F (37.1 C) (10/10 0716) Pulse Rate:  [58-72] 62 (10/10 0716) Resp:  [17-18] 18 (10/10 0716) BP: (95-107)/(44-58) 104/53 (10/10 0716) SpO2:  [98 %-100 %] 100 % (10/10 0716)  Physical Exam:  General: alert, cooperative and no distress Lochia: appropriate Uterine Fundus: firm Incision: honeycomb dressing intact DVT Evaluation: No evidence of DVT seen on physical exam.  Recent Labs    06/11/20 0416 06/11/20 0634  HGB 8.5* 7.6*  HCT 26.9* 24.2*    Assessment/Plan: Status post Cesarean section. Doing well postoperatively.  Continue current care.  Brandi Lee 06/12/2020, 7:35 AM

## 2020-06-13 MED ORDER — FERROUS SULFATE 325 (65 FE) MG PO TABS
325.0000 mg | ORAL_TABLET | Freq: Two times a day (BID) | ORAL | 3 refills | Status: AC
Start: 2020-06-13 — End: ?

## 2020-06-13 MED ORDER — IBUPROFEN 800 MG PO TABS: 800.0000 mg | ORAL_TABLET | Freq: Three times a day (TID) | ORAL | 2 refills | Status: AC | PRN

## 2020-06-13 MED ORDER — OXYCODONE HCL 5 MG PO TABS
5.0000 mg | ORAL_TABLET | ORAL | 0 refills | Status: AC | PRN
Start: 2020-06-13 — End: ?

## 2020-06-13 MED ORDER — SENNOSIDES-DOCUSATE SODIUM 8.6-50 MG PO TABS: 2.0000 | ORAL_TABLET | Freq: Every evening | ORAL | 1 refills | Status: AC | PRN

## 2020-06-13 NOTE — Anesthesia Postprocedure Evaluation (Signed)
Anesthesia Post Note  Patient: Brandi Lee  Procedure(s) Performed: CESAREAN SECTION and IUD insertion (N/A )     Patient location during evaluation: PACU Anesthesia Type: Spinal Level of consciousness: awake Pain management: pain level controlled Vital Signs Assessment: post-procedure vital signs reviewed and stable Respiratory status: spontaneous breathing Cardiovascular status: stable Postop Assessment: no headache, no backache, spinal receding, patient able to bend at knees and no apparent nausea or vomiting Anesthetic complications: no   No complications documented.  Last Vitals:  Vitals:   06/12/20 2311 06/13/20 0442  BP: (!) 106/50 (!) 110/57  Pulse: 64 68  Resp: 18 18  Temp: 37.1 C 37.1 C  SpO2: 100% 99%    Last Pain:  Vitals:   06/13/20 0732  TempSrc:   PainSc: 0-No pain   Pain Goal: Patients Stated Pain Goal: 3 (06/12/20 2005)                 Caren Macadam

## 2020-06-13 NOTE — Progress Notes (Signed)
Patient screened out for psychosocial assessment since none of the following apply:  Psychosocial stressors documented in mother or baby's chart  Gestation less than 32 weeks  Code at delivery   Infant with anomalies Please contact the Clinical Social Worker if specific needs arise, by MOB's request, or if MOB scores greater than 9/yes to question 10 on Edinburgh Postpartum Depression Screen.  Ayan Heffington, LCSW Clinical Social Worker [facility] Cell#: [phone]     

## 2020-06-14 ENCOUNTER — Inpatient Hospital Stay (HOSPITAL_COMMUNITY)
Admission: RE | Admit: 2020-06-14 | Discharge: 2020-06-14 | Disposition: A | Discharge status: Home / Self Care | Payer: Medicaid Other | Source: Ambulatory Visit

## 2020-06-16 ENCOUNTER — Inpatient Hospital Stay (HOSPITAL_COMMUNITY)
Admission: RE | Admit: 2020-06-16 | Payer: Medicaid Other | Source: Home / Self Care | Admitting: Obstetrics & Gynecology

## 2020-06-16 ENCOUNTER — Ambulatory Visit: Payer: Self-pay

## 2020-06-16 NOTE — Lactation Note (Addendum)
This note was copied from a baby's chart. Lactation Consultation Note  Patient Name: Brandi Lee JSHFW'Y Date: 06/16/2020 Reason for consult: Follow-up assessment;1st time breastfeeding;NICU baby;Early term 37-38.6wks  RN requested lactation consult.  [name] is being discharged today, 56 days old and [redacted]w[redacted]d and weighs 6 lbs 3 oz today.    Baby has been exclusively bottle feeding formula+/expressed breast milk.  Mom desires to breast feed.  Mom has been using a hand pump at home and pumping every 3 hrs.  Mom brought in 4 bottles of 30 ml each.  Mom states she hadn't pumped in 9 hrs because she wanted to breast feed baby.  Breasts full, but not engorged.  Brandi Lee just had 40 ml of his feeding.  Mom came in and RN stopped his bottle feeding.    Assisted Mom with positioning and support in football hold on left breast.  Mom receptive to teaching.  Mom has an erect nipple, some swelling around nipple on areola and nipple short.  Baby opened for the breast when placed STS, but he was unable to sustain a deep latch.  LC initiated a 20 mm nipple shield, instructing Mom how to apply to breast correctly (return demo done by Mom).  Baby took a few attempts and encouragement, but eventually he was able establish a deep latch and suck swallow consistently for 10 mins.  Mom feeling a tug, no pinching and drops in jaw consistent with swallows identified.    Set Mom up with DEBP in room and assisted Mom to pump on maintenance setting.  Instructed Mom to pump for 15-30 min after each time baby breastfeeds.  Mom desires exclusive breastfeeding.  Mom has an appt with [facility] on 10/19.  LC called WIC to expedite DEBP.  WIC called parents.    Also talked to parents about OP lactation follow-up.  Mom is interested, message sent to clinic.  Plan- 1- Keep baby STS as much as possible 2- Offer breast with cues 3- Try to latch to breast, but use nipple shield as needed to help stimulate baby's suck  and latch 4- use breast compression during sucking to increase milk transfer 5- Pump both breasts after breastfeeding 6- supplement baby with EBM per guidelines 30-60 ml. 7- follow-up with OP lactation appointment, referral sent  8- call for assistance prn.    Feeding instructions written out and provided to parents. Mom knows to call prn for assistance.   Feeding Feeding Type: Breast Fed Nipple Type: Nfant Extra Slow Flow (gold)  LATCH Score Latch: Repeated attempts needed to sustain latch, nipple held in mouth throughout feeding, stimulation needed to elicit sucking reflex.  Audible Swallowing: Spontaneous and intermittent  Type of Nipple: Everted at rest and after stimulation  Comfort (Breast/Nipple): Soft / non-tender  Hold (Positioning): Assistance needed to correctly position infant at breast and maintain latch.  LATCH Score: 8  Interventions Interventions: Breast feeding basics reviewed;Assisted with latch;Skin to skin;Breast massage;Hand express;Pre-pump if needed;Breast compression;Adjust position;Support pillows;Position options;Expressed milk;DEBP;Hand pump  Lactation Tools Discussed/Used Tools: Nipple Shields;Pump;Bottle;Flanges Nipple shield size: 20 Flange Size: 24 Breast pump type: Double-Electric Breast Pump   Consult Status Consult Status: Follow-up Date: 06/20/20 Follow-up type: Out-patient    Brandi Lee 06/16/2020, 12:29 PM

## 2020-06-17 ENCOUNTER — Ambulatory Visit: Payer: Medicaid Other

## 2020-06-17 ENCOUNTER — Encounter: Payer: Medicaid Other | Admitting: Family Medicine

## 2020-06-20 ENCOUNTER — Ambulatory Visit: Payer: Medicaid Other | Admitting: *Deleted

## 2020-06-20 ENCOUNTER — Encounter: Payer: Self-pay | Admitting: *Deleted

## 2020-06-20 ENCOUNTER — Other Ambulatory Visit: Payer: Self-pay

## 2020-06-20 VITALS — BP 105/67 | HR 87 | Ht 59.0 in | Wt 113.0 lb

## 2020-06-20 DIAGNOSIS — Z4889 Encounter for other specified surgical aftercare: Secondary | ICD-10-CM

## 2020-06-20 NOTE — Progress Notes (Signed)
Pt presents for incision check s/p rLTCS on 10/9. She reports occasional soreness however no pain. Honeycomb dressing and steri strips were removed. Incision was found to be well healed and without redness, swelling or drainage. Pt was advised of proper cleaning of incision. She was reminded to refrain from sex and no tub baths until seen @ PP appt on 11/18 @ 1435.  Pt voiced understanding of all information and instructions given.

## 2020-06-24 ENCOUNTER — Encounter: Payer: Medicaid Other | Admitting: Obstetrics & Gynecology

## 2020-07-21 ENCOUNTER — Encounter: Payer: Self-pay | Admitting: Obstetrics and Gynecology

## 2020-07-21 ENCOUNTER — Other Ambulatory Visit: Payer: Self-pay

## 2020-07-21 ENCOUNTER — Ambulatory Visit (INDEPENDENT_AMBULATORY_CARE_PROVIDER_SITE_OTHER): Payer: Medicaid Other | Admitting: Obstetrics and Gynecology

## 2020-07-21 DIAGNOSIS — Z30430 Encounter for insertion of intrauterine contraceptive device: Secondary | ICD-10-CM | POA: Diagnosis not present

## 2020-07-21 DIAGNOSIS — Z975 Presence of (intrauterine) contraceptive device: Secondary | ICD-10-CM | POA: Diagnosis not present

## 2020-07-21 MED ORDER — LEVONORGESTREL 19.5 MCG/DAY IU IUD: INTRAUTERINE_SYSTEM | Freq: Once | INTRAUTERINE | Status: AC

## 2020-07-21 NOTE — Progress Notes (Signed)
Post Partum Visit Note  Daylan Boggess is a 24 y.o. G87P1102 female who presents for a postpartum visit. She is 5 weeks postpartum following a repeat cesarean section.  I have fully reviewed the prenatal and intrapartum course. The delivery was at *38/3* gestational weeks.  Anesthesia: spinal. Postpartum course has been uncomplicated. Baby is doing well. Baby is feeding by breast. Bleeding thin lochia. Bowel function is normal. Bladder function is normal. Patient is not sexually active. Contraception method is none. Postpartum depression screening: negative.  Patient reports she accidentally pulled her IUD out. She would like to have it replaced.    The pregnancy intention screening data noted above was reviewed. Potential methods of contraception were discussed. The patient elected to proceed with IUD or IUS.    Edinburgh Postnatal Depression Scale - 07/21/20 1443      Edinburgh Postnatal Depression Scale:  In the Past 7 Days   I have been able to laugh and see the funny side of things. 0    I have looked forward with enjoyment to things. 0    I have blamed myself unnecessarily when things went wrong. 0    I have been anxious or worried for no good reason. 0    I have felt scared or panicky for no good reason. 0    Things have been getting on top of me. 0    I have been so unhappy that I have had difficulty sleeping. 0    I have felt sad or miserable. 0    I have been so unhappy that I have been crying. 0    The thought of harming myself has occurred to me. 0    Edinburgh Postnatal Depression Scale Total 0            The following portions of the patient's history were reviewed and updated as appropriate: allergies, current medications, past family history, past medical history, past social history, past surgical history and problem list.  Review of Systems Pertinent items are noted in HPI.    Objective:  BP (!) 103/56   Pulse 75   Ht 4\' 11"  (1.499 m)   Wt 110 lb 1.6 oz (49.9  kg)   LMP 09/16/2019   Breastfeeding Yes   BMI 22.24 kg/m    General:  alert, cooperative and appears stated age   Breasts:  inspection negative, no nipple discharge or bleeding, no masses or nodularity palpable  Lungs: clear to auscultation bilaterally  Heart:  regular rate and rhythm, S1, S2 normal, no murmur, click, rub or gallop  Abdomen: soft, non-tender; bowel sounds normal; no masses,  no organomegaly   Vulva:  normal  Vagina: normal vagina  Cervix:  no lesions  Corpus: not examined  Adnexa:  normal adnexa  Rectal Exam: Not performed        Assessment:    Normal postpartum exam. Pap smear not done at today's visit.   Plan:   Essential components of care per ACOG recommendations:  1.  Mood and well being: Patient with negative depression screening today. Reviewed local resources for support.  - Patient does not use tobacco.   2. Infant care and feeding:  -Patient currently breastmilk feeding? Yes  -Social determinants of health (SDOH) reviewed in EPIC. No concerns.   3. Sexuality, contraception and birth spacing - Patient does not want a pregnancy in the next year.   - Reviewed forms of contraception in tiered fashion. Patient desired IUD today.   -  Discussed birth spacing of 18 months  4. Sleep and fatigue -Encouraged family/partner/community support of 4 hrs of uninterrupted sleep to help with mood and fatigue  5. Physical Recovery  - Discussed patients delivery and complications - Patient has urinary incontinence? No   - Patient is safe to resume physical and sexual activity  6.  Health Maintenance - Last pap smear done 02/02/2020 and was normal with negative HPV.    Gita Kudo, MD Center for [facility], [facility]

## 2020-07-21 NOTE — Progress Notes (Signed)
° ° °  GYNECOLOGY OFFICE PROCEDURE NOTE  Brandi Lee is a 24 y.o. [id number] here for Liletta IUD insertion.    IUD Insertion Procedure Note Patient identified, informed consent performed, consent signed.   Discussed risks of irregular bleeding, cramping, infection, malpositioning or misplacement of the IUD outside the uterus which may require further procedure such as laparoscopy. Also discussed >99% contraception efficacy, increased risk of ectopic pregnancy with failure of method.   Emphasized that this did not protect against STIs, condoms recommended during all sexual encounters. Time out was performed.  Urine pregnancy test negative.  Speculum placed in the vagina.  Cervix visualized.  Cleaned with Betadine x 2.  Grasped anteriorly with a single tooth tenaculum.  Uterus sounded to 8 cm.  Liletta IUD placed per manufacturer's recommendations.  Strings trimmed to 3 cm. Tenaculum was removed, good hemostasis noted.  Patient tolerated procedure well.   Patient was given post-procedure instructions.  She was advised to have backup contraception for one week.  Patient was also asked to check IUD strings periodically and follow up in 4 weeks for IUD check.   Casper Harrison, MD [facility] Family Medicine Fellow, [facility] for [facility], [facility]

## 2020-08-10 ENCOUNTER — Encounter: Payer: Self-pay | Admitting: *Deleted

## 2020-08-19 ENCOUNTER — Ambulatory Visit (INDEPENDENT_AMBULATORY_CARE_PROVIDER_SITE_OTHER): Payer: Medicaid Other | Admitting: Family Medicine

## 2020-08-19 ENCOUNTER — Other Ambulatory Visit: Payer: Self-pay

## 2020-08-19 ENCOUNTER — Encounter: Payer: Self-pay | Admitting: Family Medicine

## 2020-08-19 ENCOUNTER — Ambulatory Visit: Payer: Medicaid Other | Admitting: Certified Nurse Midwife

## 2020-08-19 DIAGNOSIS — O09899 Supervision of other high risk pregnancies, unspecified trimester: Secondary | ICD-10-CM | POA: Diagnosis not present

## 2020-08-19 DIAGNOSIS — O0990 Supervision of high risk pregnancy, unspecified, unspecified trimester: Secondary | ICD-10-CM

## 2020-08-19 DIAGNOSIS — Z975 Presence of (intrauterine) contraceptive device: Secondary | ICD-10-CM

## 2020-08-19 NOTE — Progress Notes (Signed)
   GYNECOLOGY OFFICE VISIT NOTE  History:   Brandi Lee is a 24 y.o. [id number] here today for IUD string chek.  Doing well Happy with IUD Still having some bleeding and spotting  Past Medical History:  Diagnosis Date  . Medical history non-contributory     Past Surgical History:  Procedure Laterality Date  . CESAREAN SECTION    . CESAREAN SECTION N/A 06/11/2020   Procedure: CESAREAN SECTION and IUD insertion;  Surgeon: Adam Phenix, MD;  Location: [facility] LD ORS;  Service: Obstetrics;  Laterality: N/A;    The following portions of the patient's history were reviewed and updated as appropriate: allergies, current medications, past family history, past medical history, past social history, past surgical history and problem list.   Health Maintenance:  Normal pap and negative HRHPV: NILM 02/02/2020.  Normal mammogram: n/a.   Review of Systems:  Pertinent items noted in HPI and remainder of comprehensive ROS otherwise negative.  Physical Exam:  BP 112/69   Pulse 80   Wt 105 lb 14.4 oz (48 kg)   LMP 09/16/2019   Breastfeeding No   BMI 21.39 kg/m  CONSTITUTIONAL: Well-developed, well-nourished female in no acute distress.  HEENT:  Normocephalic, atraumatic. External right and left ear normal. No scleral icterus.  NECK: Normal range of motion, supple, no masses noted on observation SKIN: No rash noted. Not diaphoretic. No erythema. No pallor. MUSCULOSKELETAL: Normal range of motion. No edema noted. NEUROLOGIC: Alert and oriented to person, place, and time. Normal muscle tone coordination.  PSYCHIATRIC: Normal mood and affect. Normal behavior. Normal judgment and thought content. RESPIRATORY: Effort normal, no problems with respiration noted PELVIC: Normal appearing external genitalia; normal appearing vaginal mucosa and cervix. Iud strings visible and ~3cm in length. Small amount of blood in the vault.   Labs and Imaging No results found for this or any previous visit (from the past  168 hour(s)). No results found.    Assessment and Plan:   Problem List Items Addressed This Visit      Other   IUD (intrauterine device) in place    Well positioned Return PRN      RESOLVED: Supervision of high risk pregnancy, antepartum   RESOLVED: History of preterm delivery, currently pregnant      Routine preventative health maintenance measures emphasized. Please refer to After Visit Summary for other counseling recommendations.   Return if symptoms worsen or fail to improve.    Total face-to-face time with patient: 10 minutes.  Over 50% of encounter was spent on counseling and coordination of care.   Venora Maples, MD/MPH Center for [employer], [facility]

## 2020-08-19 NOTE — Assessment & Plan Note (Signed)
Well positioned Return PRN

## 2021-10-25 IMAGING — US US MFM OB DETAIL+14 WK
1 series · 13 of 28 positions shown · non-contrast
Comparison: none

[Series 1: us mfm ob detail+14 wk · 119 acquisitions, 13 frames shown]
[im 5/119]
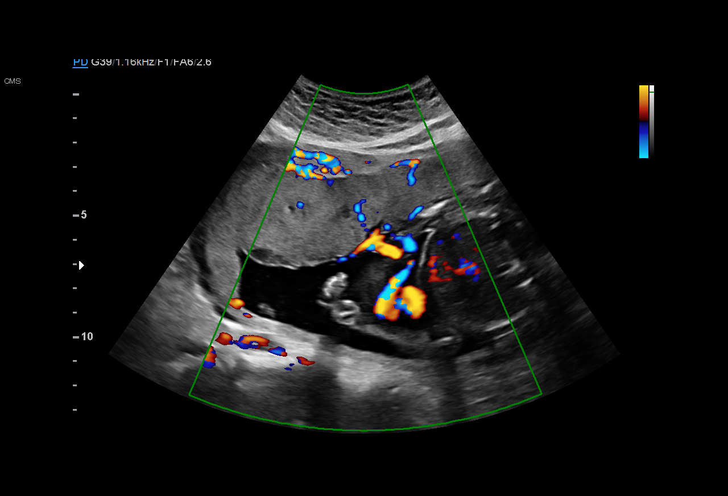
[im 14/119]
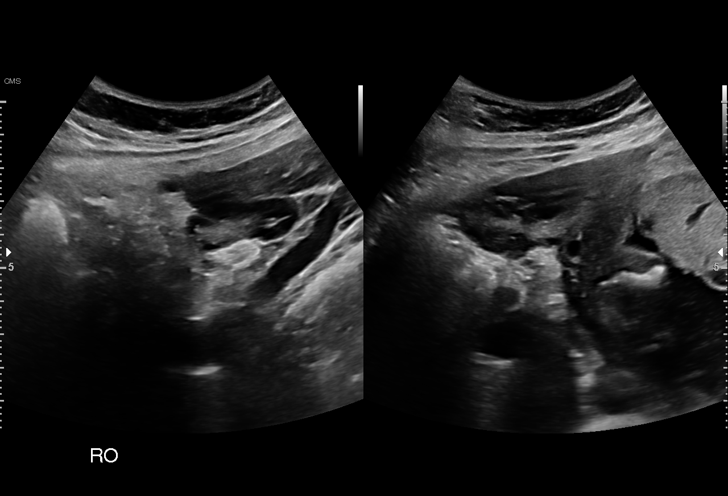
[im 22/119]
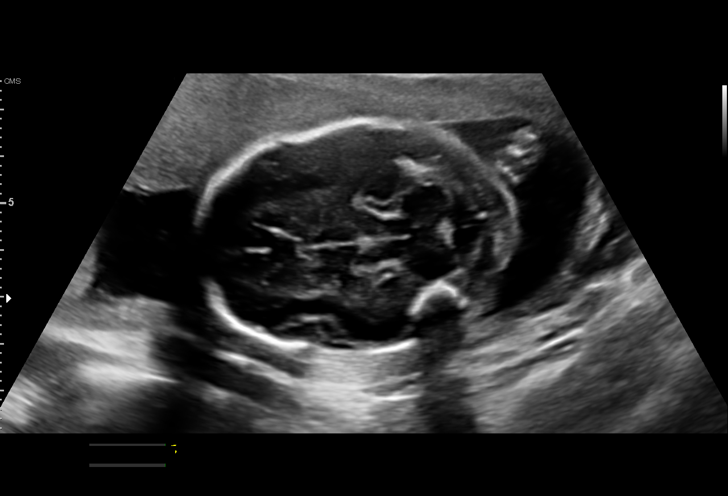
[im 31/119]
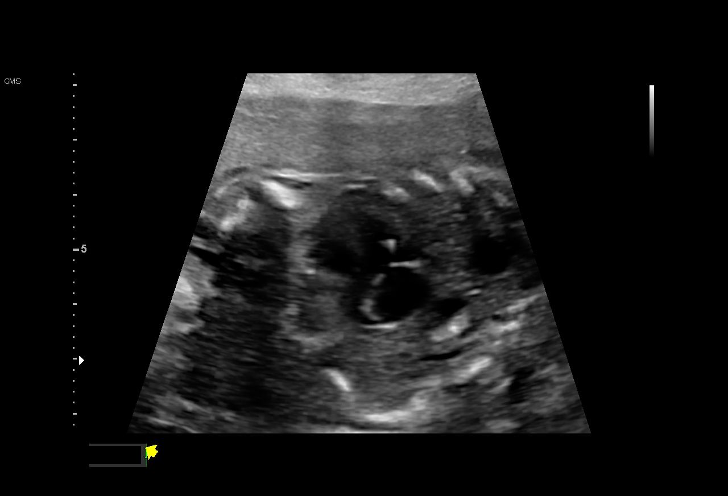
[im 40/119]
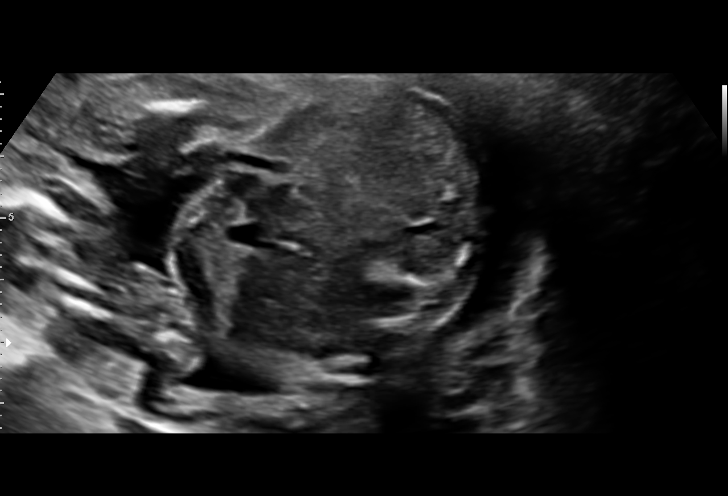
[im 49/119]
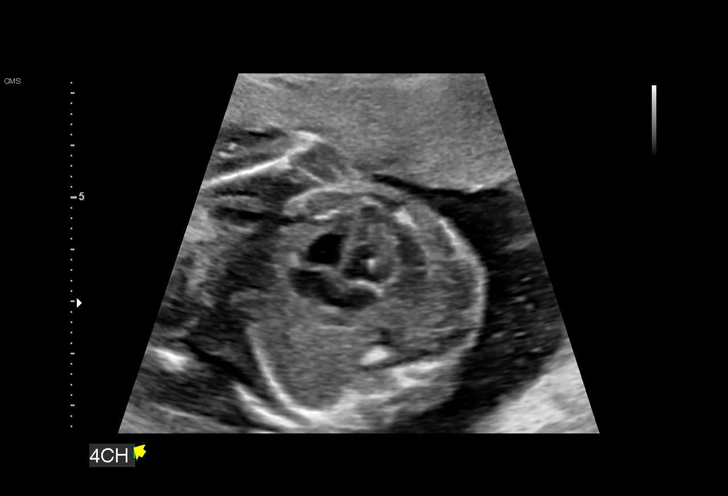
[im 62/119]
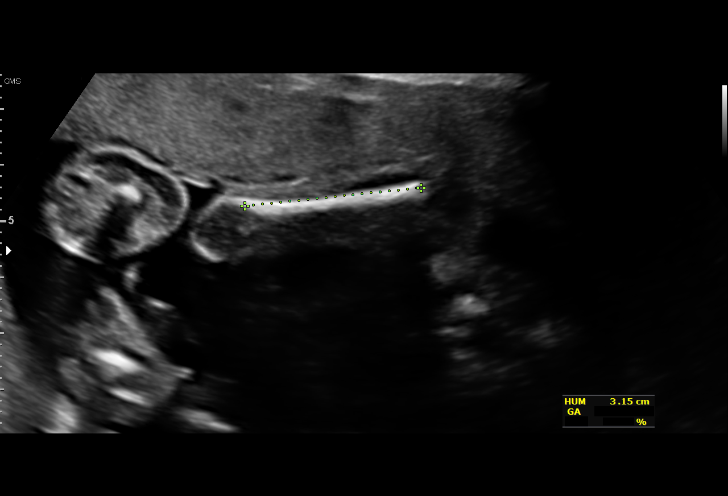
[im 70/119]
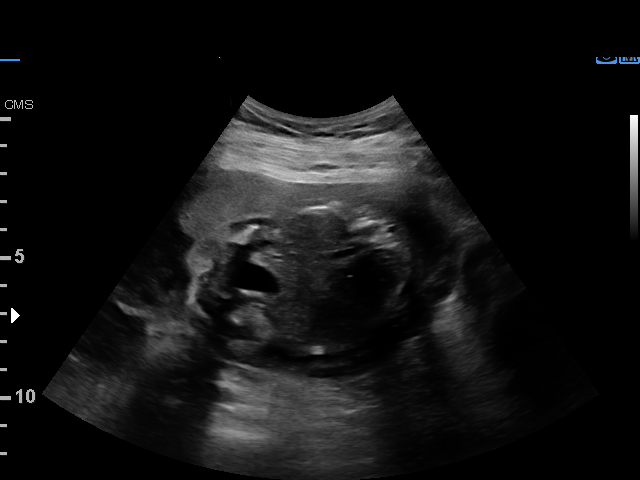
[im 79/119]
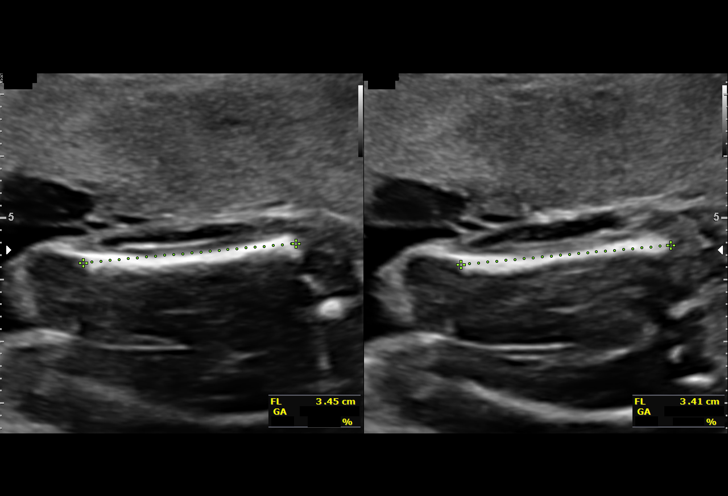
[im 88/119]
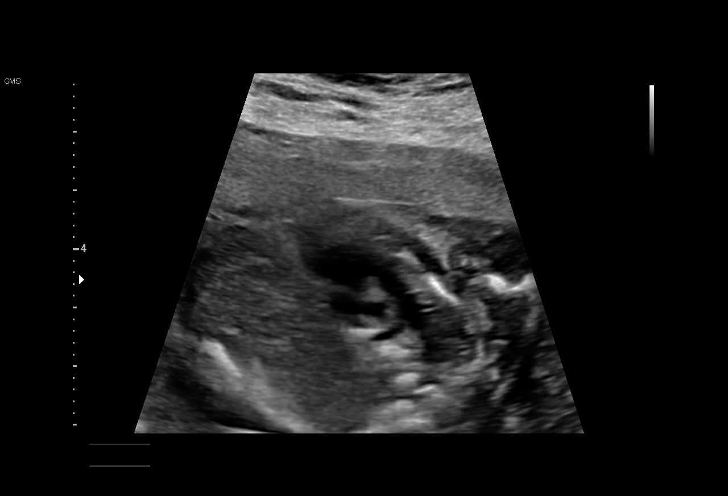
[im 97/119]
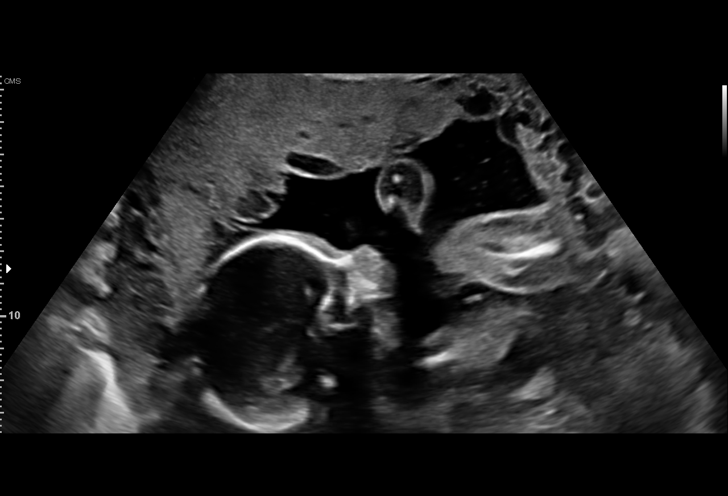
[im 105/119]
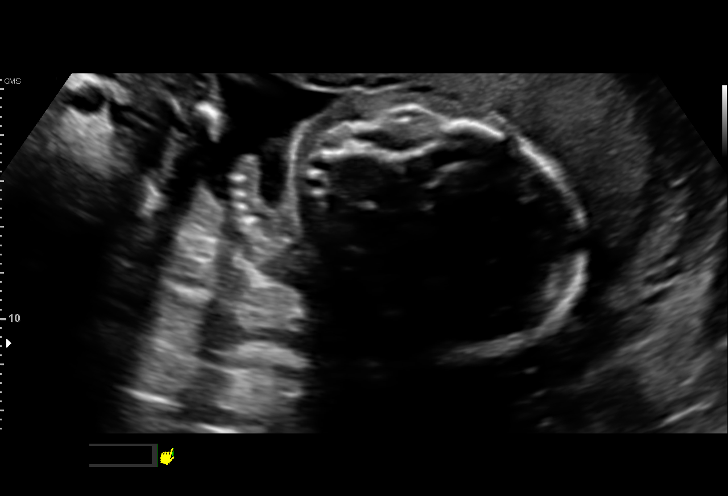
[im 114/119]
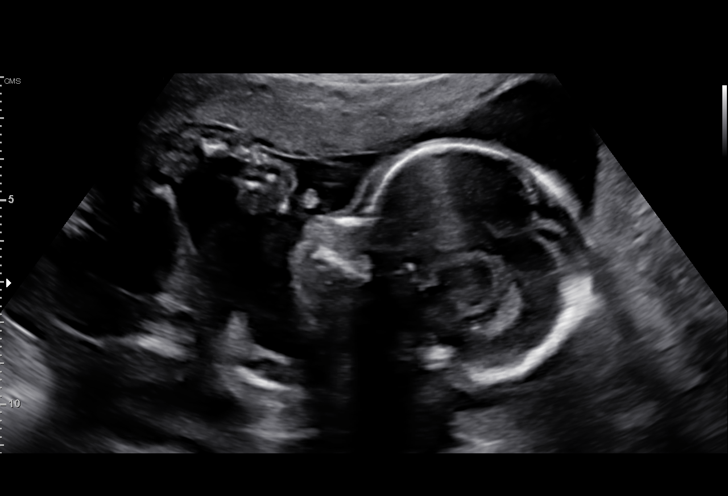

[13 of 28 positions shown; findings below may reference images not displayed]

Indications

 Fetal abnormality - other known or
 suspected (LVEIF)
 Maternal care for known or suspected poor
 fetal growth, second trimester, fetus 1 IUGR
 Previous cesarean delivery, antepartum
 21 weeks gestation of pregnancy
 Antenatal screening for malformations
 Poor obstetric history: Previous preterm
 delivery, antepartum (32 weeks)
Fetal Evaluation

 Num Of Fetuses:         1
 Fetal Heart Rate(bpm):  161
 Cardiac Activity:       Observed
 Presentation:           Transverse, head to maternal left
 Placenta:               Anterior Fundal
 P. Cord Insertion:      Visualized

 Amniotic Fluid
 AFI FV:      Within normal limits

                             Largest Pocket(cm)

Biometry

 BPD:        49  mm     G. Age:  20w 6d         41  %    CI:        66.62   %    70 - 86
                                                         FL/HC:      17.8   %    15.9 -
 HC:      192.4  mm     G. Age:  21w 3d         63  %    HC/AC:      1.13        1.06 -
 AC:      170.2  mm     G. Age:  22w 0d         75  %    FL/BPD:     70.0   %
 FL:       34.3  mm     G. Age:  20w 6d         34  %    FL/AC:      20.2   %    20 - 24
 HUM:      32.3  mm     G. Age:  20w 6d         42  %
 CER:      22.3  mm     G. Age:  21w 1d         55  %
 LV:        5.4  mm
 CM:        4.8  mm

 Est. FW:     423  gm    0 lb 15 oz      68  %
OB History

 Gravidity:    2         Term:   0        Prem:   1        SAB:   0
 TOP:          0       Ectopic:  0        Living: 1
Gestational Age

 LMP:           21w 0d        Date:  09/16/19                 EDD:   06/22/20
 U/S Today:     21w 2d                                        EDD:   06/20/20
 Best:          21w 0d     Det. By:  LMP  (09/16/19)          EDD:   06/22/20
Anatomy

 Cranium:               Appears normal         LVOT:                   Appears normal
 Cavum:                 Appears normal         Aortic Arch:            Appears normal
 Ventricles:            Appears normal         Ductal Arch:            Appears normal
 Choroid Plexus:        Appears normal         Diaphragm:              Appears normal
 Cerebellum:            Appears normal         Stomach:                Appears normal, left
                                                                       sided
 Posterior Fossa:       Appears normal         Abdomen:                Appears normal
 Nuchal Fold:           Not applicable (>20    Abdominal Wall:         Appears nml (cord
                        wks GA)                                        insert, abd wall)
 Face:                  Appears normal         Cord Vessels:           Appears normal (3
                        (orbits and profile)                           vessel cord)
 Lips:                  Appears normal         Kidneys:                Appear normal
 Palate:                Appears normal         Bladder:                Appears normal
 Thoracic:              Appears normal         Spine:                  Appears normal
 Heart:                 Echogenic focus        Upper Extremities:      Appears normal
                        in LV
 RVOT:                  Appears normal         Lower Extremities:      Appears normal

 Other:  Heels/feet and open hands/5th digits visualized. Nasal bone
         visualized.
Cervix Uterus Adnexa

 Cervix
 Length:           3.27  cm.
 Normal appearance by transabdominal scan.

 Uterus
 No abnormality visualized.

 Right Ovary
 Within normal limits.

 Left Ovary
 Within normal limits.

 Cul De Sac
 No free fluid seen.

 Adnexa
 No abnormality visualized.
Comments

 This patient was seen for a detailed fetal anatomy scan due
 to a history of a prior growth restricted fetus in her prior
 pregnancy.  The patient reports that her baby weighed 2
 pounds at 32 weeks.
 She denies any other significant past medical history and
 denies any problems in her current pregnancy.
 She has not had any screening tests for fetal aneuploidy
 drawn in her current pregnancy.
 She was informed that the fetal growth and amniotic fluid
 level were appropriate for her gestational age.
 On today's exam, an intracardiac echogenic focus was noted
 in the left ventricle of the fetal heart.  The small association
 between an echogenic focus and Down syndrome was
 discussed. Due to the echogenic focus noted today, the
 patient was offered and declined an amniocentesis today for
 definitive diagnosis of fetal aneuploidy.  She was also offered
 and declined a screening test for fetal aneuploidy today.
 The patient was informed that anomalies may be missed due
 to technical limitations. If the fetus is in a suboptimal position
 or maternal habitus is increased, visualization of the fetus in
 the maternal uterus may be impaired.
 Due to her history of a prior growth restricted fetus, we will
 continue to follow her with serial growth ultrasounds.
 A follow-up exam was scheduled in 4 weeks.  We will
 reassess the fetal anatomy again which was limited today
 due to the fetal position at her next ultrasound exam.

## 2021-11-22 IMAGING — US US MFM OB FOLLOW-UP
1 series · 14 of 25 positions shown · non-contrast
Comparison: none

[Series 1: us mfm ob follow-up · 14 of 25 slices shown]
[im 1/25]
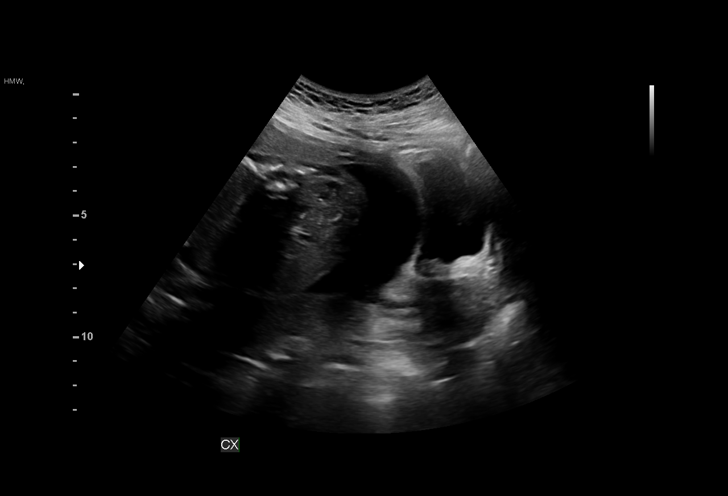
[im 3/25]
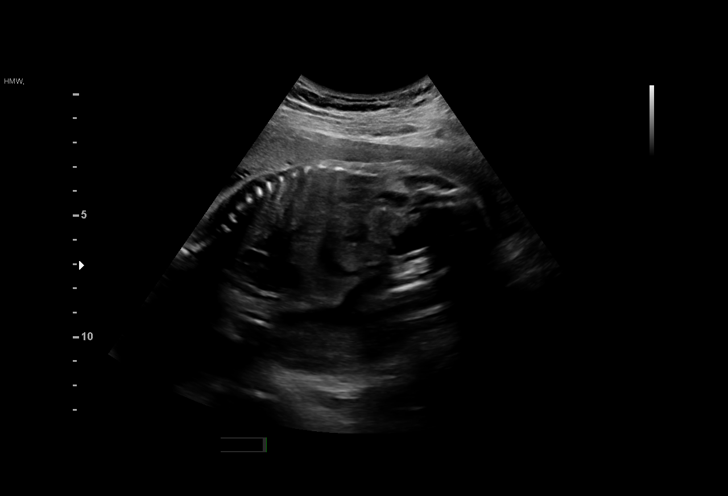
[im 5/25]
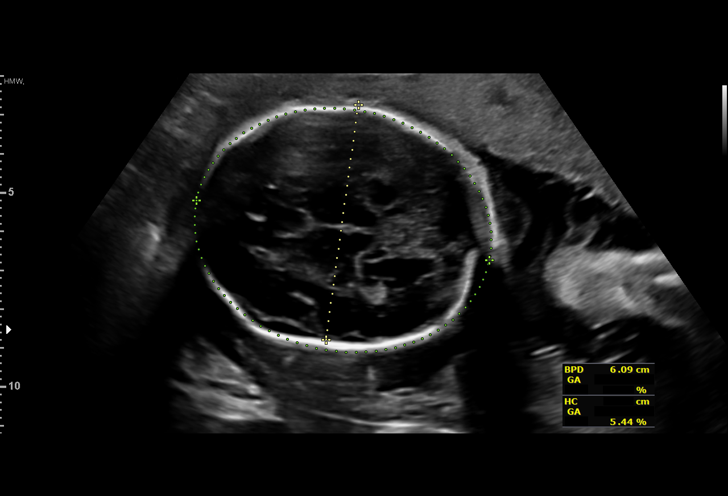
[im 7/25]
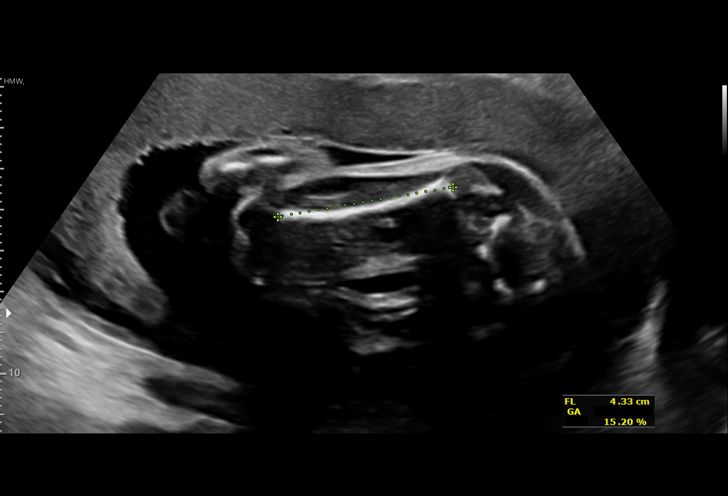
[im 9/25]
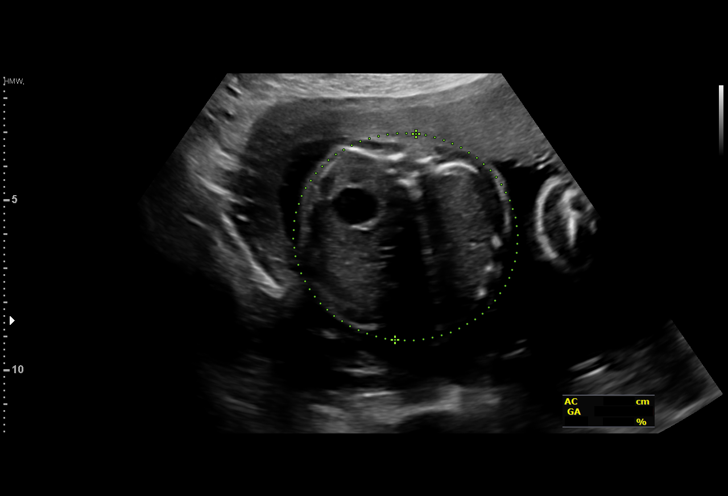
[im 10/25]
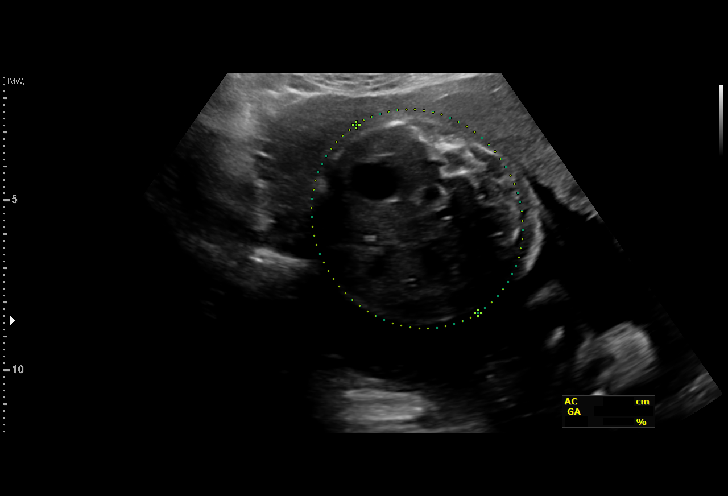
[im 12/25]
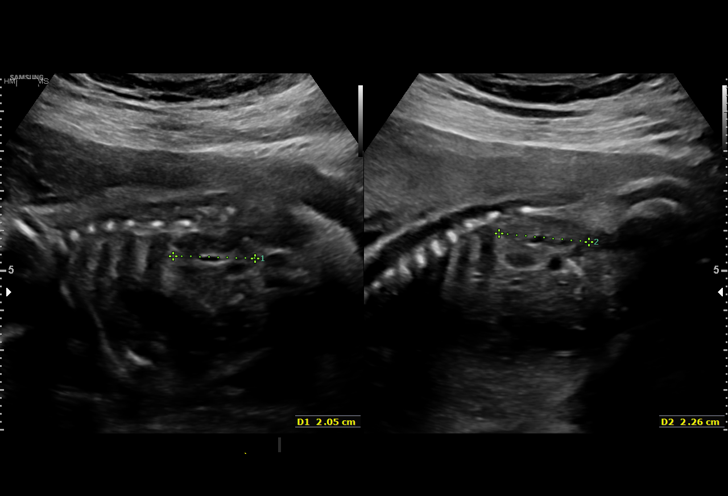
[im 14/25]
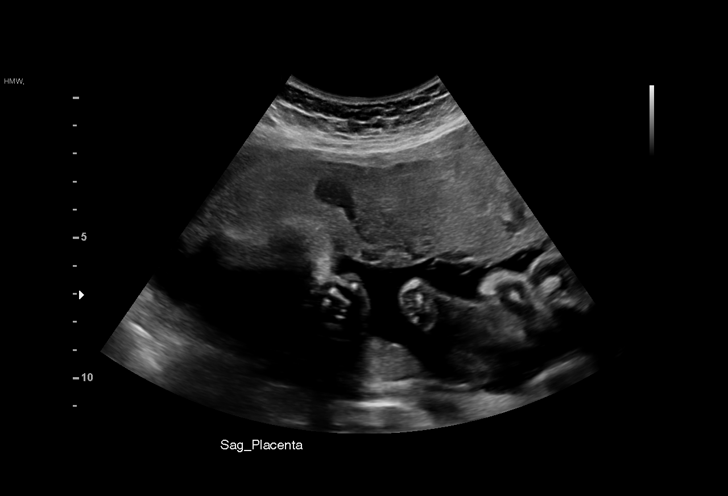
[im 16/25]
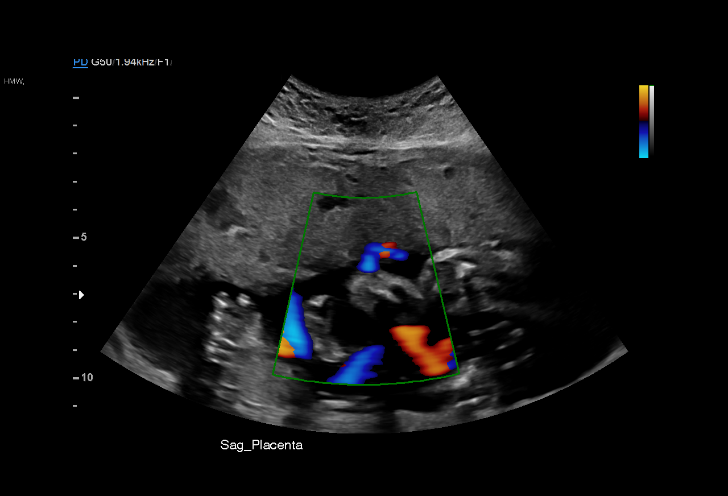
[im 17/25]
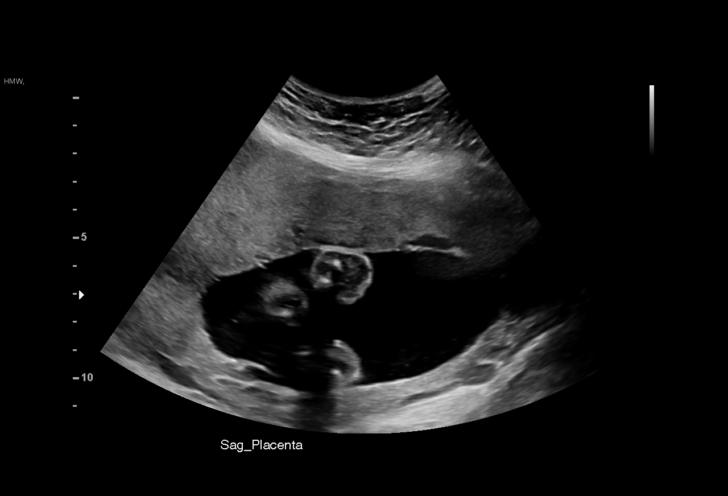
[im 19/25]
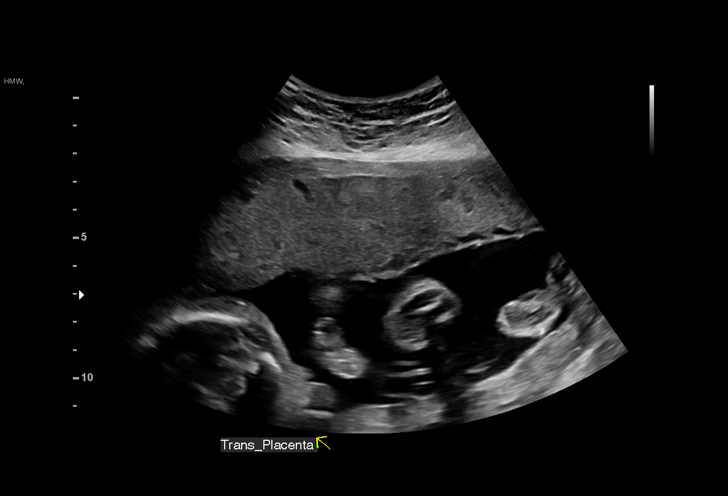
[im 21/25]
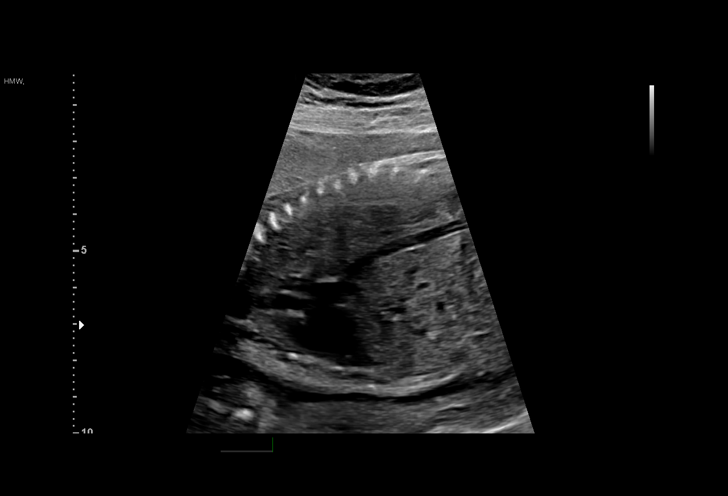
[im 23/25]
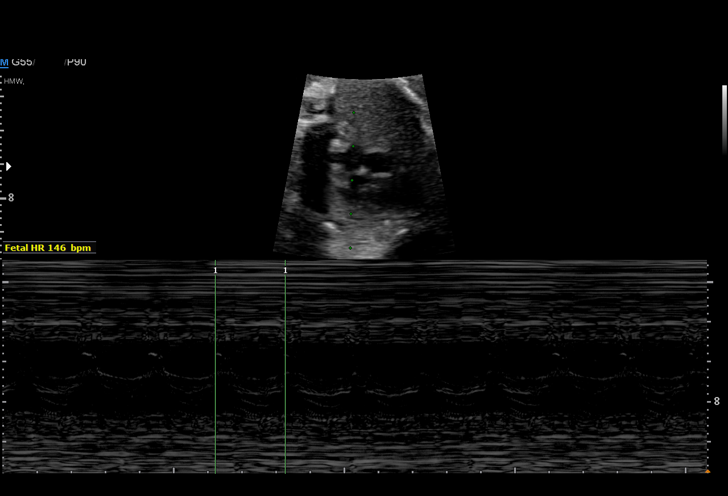
[im 25/25]
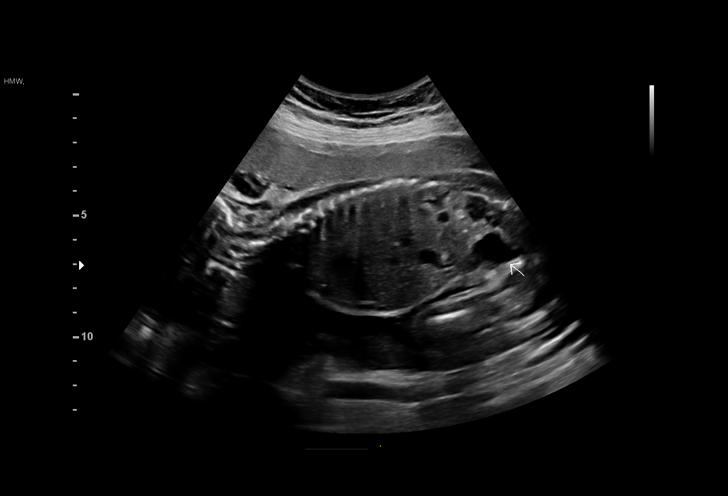

[14 of 25 positions shown; findings below may reference images not displayed]

Indications

 Fetal abnormality - other known or
 suspected (LVEIF)
 Poor obstetric history: Previous fetal growth
 restriction (FGR)
 25 weeks gestation of pregnancy
 Previous cesarean delivery, antepartum
 Poor obstetric history: Previous preterm
 delivery, antepartum (32 weeks)
Fetal Evaluation

 Num Of Fetuses:         1
 Fetal Heart Rate(bpm):  146
 Cardiac Activity:       Observed
 Presentation:           Breech
 Placenta:               Anterior Fundal
 P. Cord Insertion:      Visualized, central

 Amniotic Fluid
 AFI FV:      Within normal limits

                             Largest Pocket(cm)

Biometry

 BPD:      61.6  mm     G. Age:  25w 0d         42  %    CI:        76.46   %    70 - 86
                                                         FL/HC:      19.3   %    18.7 -
 HC:      223.2  mm     G. Age:  24w 2d         11  %    HC/AC:      1.12        1.04 -
 AC:      199.7  mm     G. Age:  24w 4d         29  %    FL/BPD:     70.0   %    71 - 87
 FL:       43.1  mm     G. Age:  24w 1d         14  %    FL/AC:      21.6   %    20 - 24

 Est. FW:     695  gm      1 lb 9 oz     18  %
OB History

 Gravidity:    2         Term:   0        Prem:   1        SAB:   0
 TOP:          0       Ectopic:  0        Living: 1
Gestational Age

 LMP:           25w 0d        Date:  09/16/19                 EDD:   06/22/20
 U/S Today:     24w 4d                                        EDD:   06/25/20
 Best:          25w 0d     Det. By:  LMP  (09/16/19)          EDD:   06/22/20
Anatomy

 Cranium:               Appears normal         LVOT:                   Previously seen
 Cavum:                 Previously seen        Aortic Arch:            Previously seen
 Ventricles:            Appears normal         Ductal Arch:            Previously seen
 Choroid Plexus:        Previously seen        Diaphragm:              Previously seen
 Cerebellum:            Previously seen        Stomach:                Appears normal, left
                                                                       sided
 Posterior Fossa:       Previously seen        Abdomen:                Previously seen
 Nuchal Fold:           Not applicable (>20    Abdominal Wall:         Previously seen
                        wks GA)
 Face:                  Orbits and profile     Cord Vessels:           Previously seen
                        previously seen
 Lips:                  Previously seen        Kidneys:                Appear normal
 Palate:                Appears normal         Bladder:                Appears normal
 Thoracic:              Appears normal         Spine:                  Previously seen
 Heart:                 Echogenic focus        Upper Extremities:      Previously seen
                        in LV
 RVOT:                  Previously seen        Lower Extremities:      Previously seen

 Other:  Heels/feet and open hands/5th digits visualized previously. Nasal
         bone visualized previously.
Cervix Uterus Adnexa

 Cervix
 Length:            3.4  cm.
 Normal appearance by transabdominal scan.

 Uterus
 No abnormality visualized.

 Right Ovary
 No adnexal mass visualized.

 Left Ovary
 No adnexal mass visualized.

 Cul De Sac
 No free fluid seen.

 Adnexa
 No abnormality visualized.
Impression

 Follow up growth and anatomy
 Normal interval growth with cleared anatomy, normal amniotic
 fluid and fetal movement.
 Ms. Smit has a known history for IUGR
Recommendations

 Follow up growth in 4 weeks.

## 2022-01-31 IMAGING — US US MFM OB FOLLOW-UP
1 series · 14 of 26 positions shown · non-contrast
Comparison: none

[Series 2: us mfm ob follow-up · 26 acquisitions, 14 frames shown]
[im 1/26]
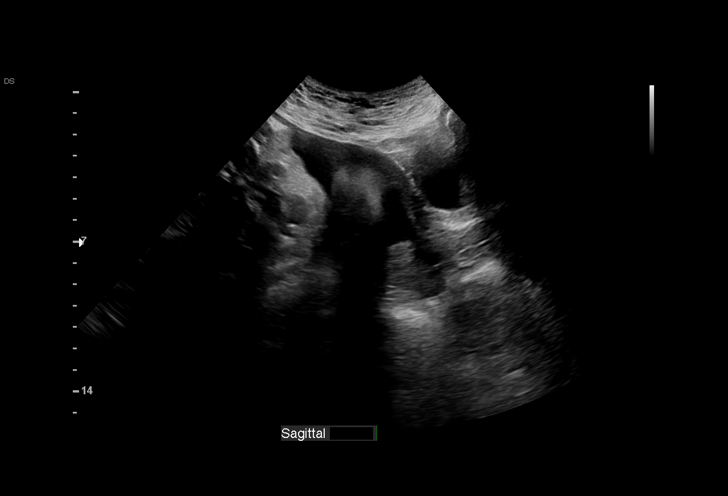
[im 3/26]
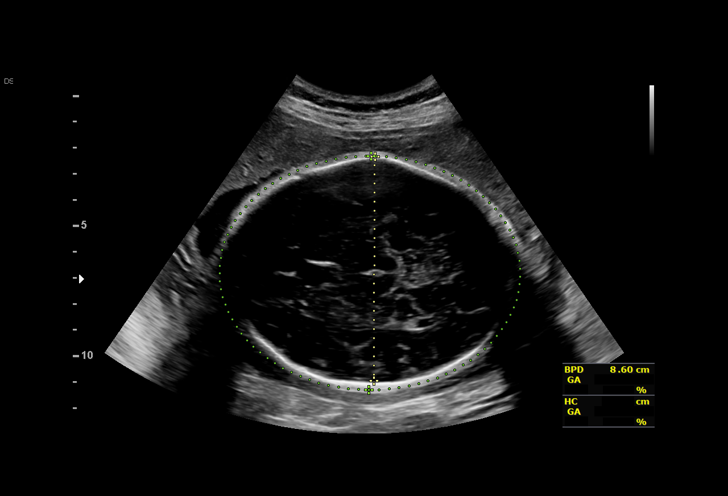
[im 5/26]
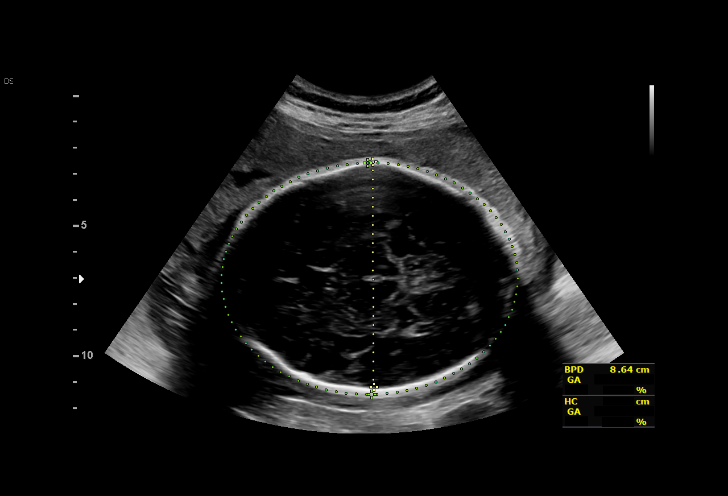
[im 7/26]
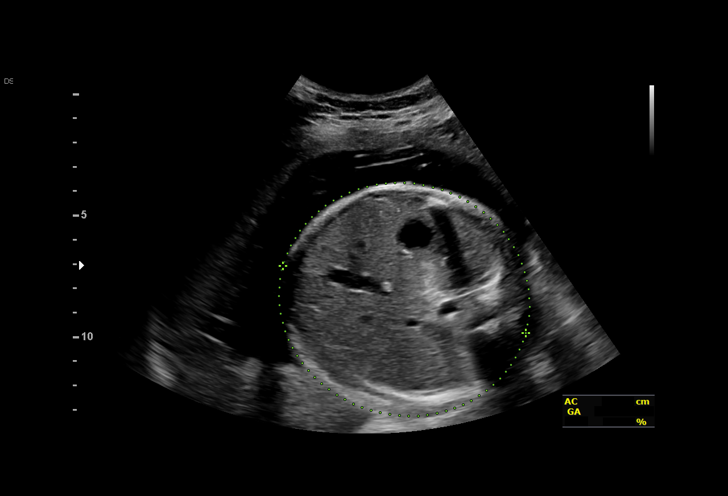
[im 9/26]
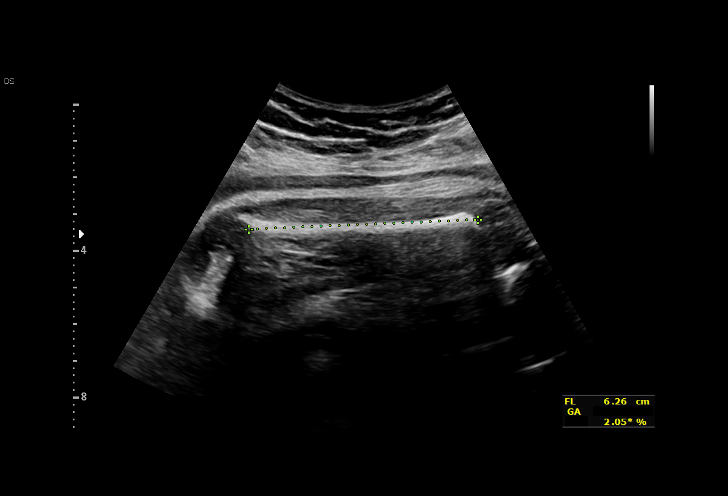
[im 11/26]
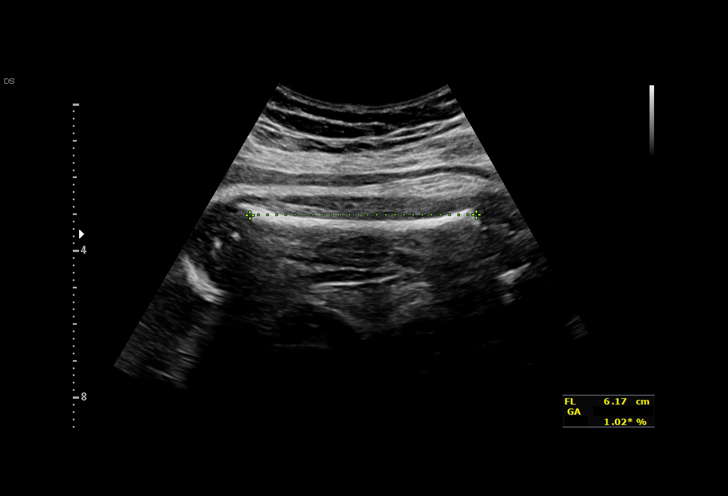
[im 13/26]
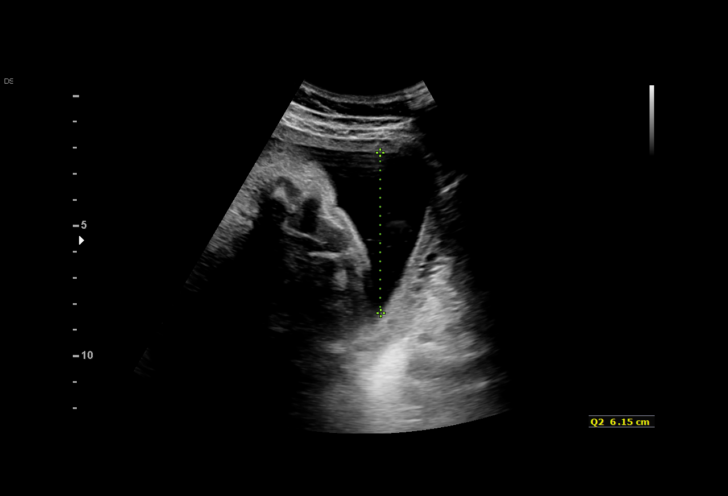
[im 14/26]
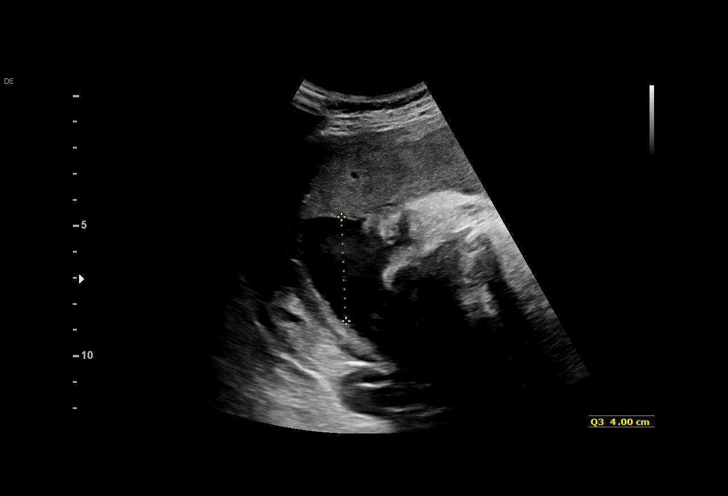
[im 16/26]
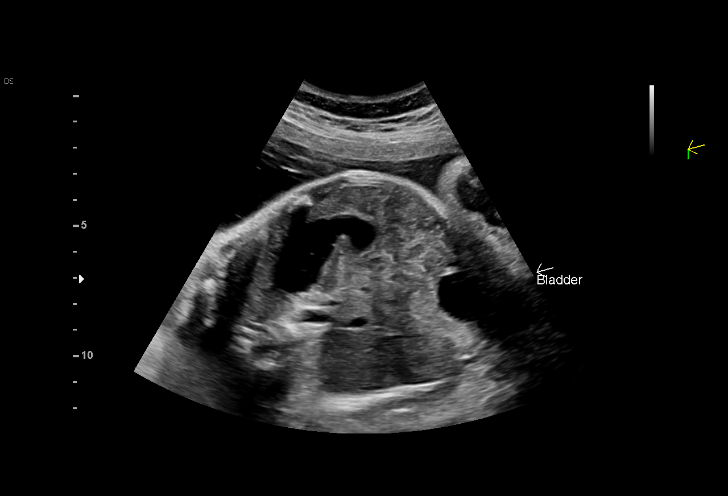
[im 18/26]
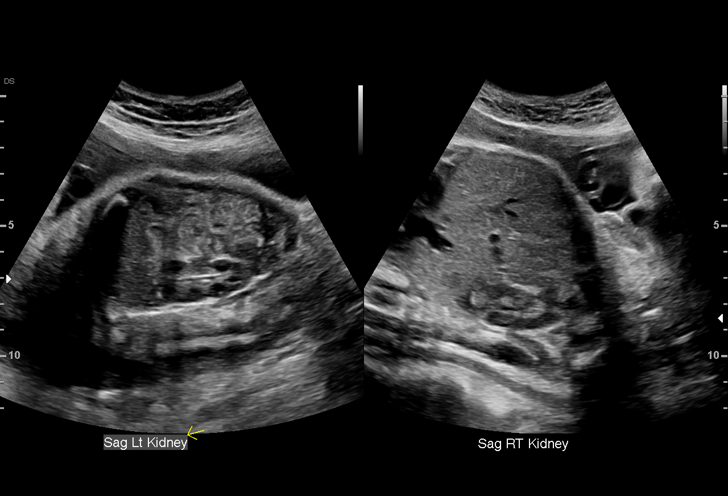
[im 20/26]
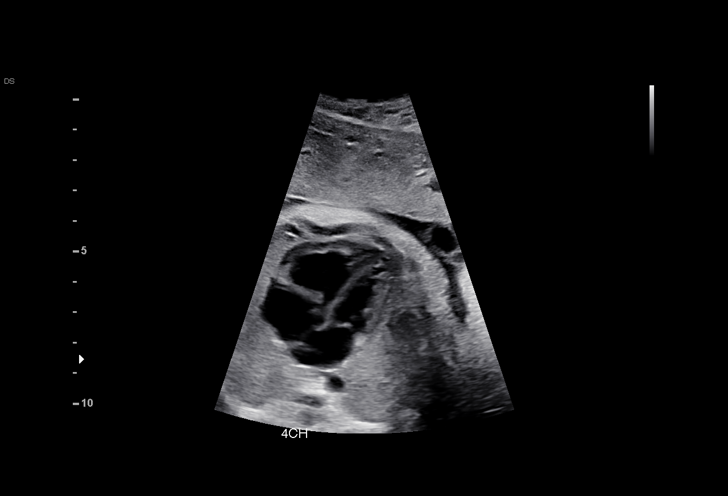
[im 22/26]
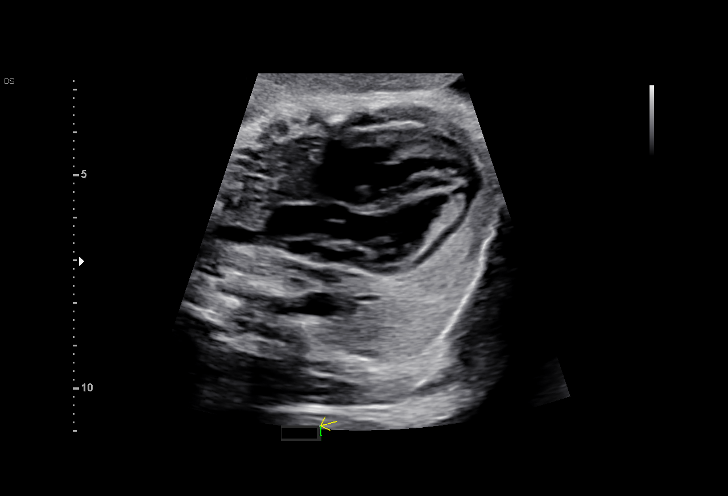
[im 24/26]
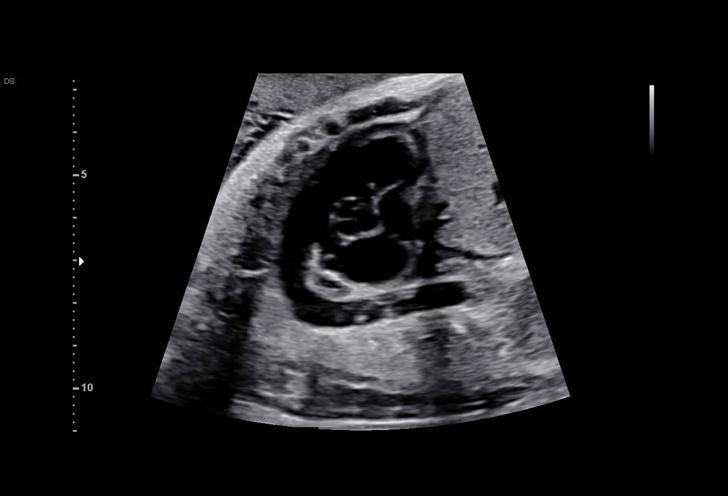
[im 26/26]
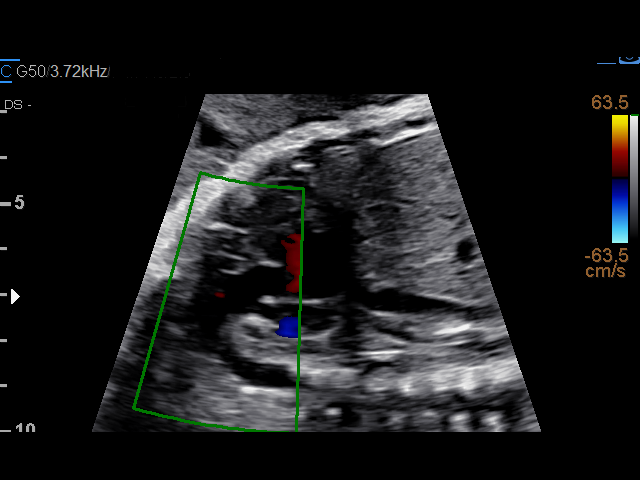

[14 of 26 positions shown; findings below may reference images not displayed]

PETRUS

Indications

 Fetal abnormality - other known or
 suspected (LVEIF)
 Poor obstetric history: Previous fetal growth
 restriction (FGR)
 Previous cesarean delivery, antepartum
 35 weeks gestation of pregnancy
Fetal Evaluation

 Num Of Fetuses:         1
 Cardiac Activity:       Observed
 Presentation:           Breech
 Placenta:               Fundal
 P. Cord Insertion:      Previously Visualized

 Amniotic Fluid
 AFI FV:      Within normal limits

 AFI Sum(cm)     %Tile       Largest Pocket(cm)
 18.14           67

 RUQ(cm)       RLQ(cm)       LUQ(cm)        LLQ(cm)
 2.71          5.28          6.15           4
Biometry
 BPD:      86.4  mm     G. Age:  34w 6d         48  %    CI:        72.67   %    70 - 86
                                                         FL/HC:      19.3   %    20.1 -
 HC:      322.3  mm     G. Age:  36w 3d         51  %    HC/AC:      1.03        0.93 -
 AC:      311.8  mm     G. Age:  35w 1d         60  %    FL/BPD:     72.1   %    71 - 87
 FL:       62.3  mm     G. Age:  32w 2d        1.6  %    FL/AC:      20.0   %    20 - 24

 Est. FW:    1115  gm      5 lb 6 oz     31  %
OB History

 Gravidity:    2         Term:   0        Prem:   1        SAB:   0
 TOP:          0       Ectopic:  0        Living: 1
Gestational Age

 LMP:           35w 0d        Date:  09/16/19                 EDD:   06/22/20
 U/S Today:     34w 5d                                        EDD:   06/24/20
 Best:          35w 0d     Det. By:  LMP  (09/16/19)          EDD:   06/22/20
Anatomy

 Cranium:               Appears normal         Aortic Arch:            Appears normal
 Cavum:                 Appears normal         Ductal Arch:            Appears normal
 Ventricles:            Appears normal         Diaphragm:              Appears normal
 Choroid Plexus:        Appears normal         Stomach:                Appears normal, left
                                                                       sided
 Heart:                 Appears normal         Kidneys:                Appear normal
                        (4CH, axis, and
                        situs)
 RVOT:                  Appears normal         Bladder:                Appears normal
 LVOT:                  Appears normal

 Other:  Other anatomy previously imaged and appeared normal.
Cervix Uterus Adnexa

 Cervix
 Not visualized (advanced GA >79wks)
Impression

 Follow up growth due to prior history of IUGR
 Normal interval growth with measurements consistent with
 dates
 Good fetal movement and amniotic fluid volume
Recommendations

 Follow up growth as clinically indicated.
